# Patient Record
Sex: Female | Born: 1975 | Race: White | Hispanic: Yes | Marital: Married | Smoking: Never smoker
Health system: Southern US, Community
[De-identification: ages and names within clinical notes are randomized; demographics above are authoritative.]

## PROBLEM LIST (undated history)

## (undated) DIAGNOSIS — O24419 Gestational diabetes mellitus in pregnancy, unspecified control: Secondary | ICD-10-CM

## (undated) DIAGNOSIS — I10 Essential (primary) hypertension: Secondary | ICD-10-CM

## (undated) DIAGNOSIS — E039 Hypothyroidism, unspecified: Secondary | ICD-10-CM

## (undated) HISTORY — DX: Essential (primary) hypertension: I10

## (undated) HISTORY — DX: Gestational diabetes mellitus in pregnancy, unspecified control: O24.419

---

## 1997-10-23 ENCOUNTER — Ambulatory Visit (HOSPITAL_COMMUNITY): Admission: RE | Admit: 1997-10-23 | Discharge: 1997-10-23 | Payer: Self-pay | Admitting: Family Medicine

## 1997-11-15 ENCOUNTER — Ambulatory Visit (HOSPITAL_COMMUNITY): Admission: RE | Admit: 1997-11-15 | Discharge: 1997-11-15 | Payer: Self-pay | Admitting: Family Medicine

## 2002-03-14 ENCOUNTER — Emergency Department (HOSPITAL_COMMUNITY): Admission: EM | Admit: 2002-03-14 | Discharge: 2002-03-14 | Payer: Self-pay | Admitting: *Deleted

## 2002-03-17 ENCOUNTER — Inpatient Hospital Stay (HOSPITAL_COMMUNITY): Admission: AD | Admit: 2002-03-17 | Discharge: 2002-03-17 | Payer: Self-pay | Admitting: *Deleted

## 2002-03-17 ENCOUNTER — Encounter: Payer: Self-pay | Admitting: *Deleted

## 2002-03-28 ENCOUNTER — Encounter: Admission: RE | Admit: 2002-03-28 | Discharge: 2002-03-28 | Payer: Self-pay | Admitting: *Deleted

## 2003-02-18 ENCOUNTER — Inpatient Hospital Stay (HOSPITAL_COMMUNITY): Admission: AD | Admit: 2003-02-18 | Discharge: 2003-02-18 | Payer: Self-pay | Admitting: Obstetrics

## 2003-02-18 ENCOUNTER — Inpatient Hospital Stay (HOSPITAL_COMMUNITY): Admission: AD | Admit: 2003-02-18 | Discharge: 2003-02-20 | Payer: Self-pay | Admitting: Obstetrics

## 2005-01-01 ENCOUNTER — Ambulatory Visit (HOSPITAL_COMMUNITY): Admission: RE | Admit: 2005-01-01 | Discharge: 2005-01-01 | Payer: Self-pay | Admitting: *Deleted

## 2005-01-23 ENCOUNTER — Inpatient Hospital Stay (HOSPITAL_COMMUNITY): Admission: AD | Admit: 2005-01-23 | Discharge: 2005-01-23 | Payer: Self-pay | Admitting: Obstetrics & Gynecology

## 2005-04-24 ENCOUNTER — Ambulatory Visit: Payer: Self-pay | Admitting: *Deleted

## 2005-04-24 ENCOUNTER — Inpatient Hospital Stay (HOSPITAL_COMMUNITY): Admission: AD | Admit: 2005-04-24 | Discharge: 2005-04-28 | Payer: Self-pay | Admitting: Obstetrics and Gynecology

## 2005-04-24 ENCOUNTER — Ambulatory Visit: Payer: Self-pay | Admitting: Obstetrics and Gynecology

## 2005-04-25 ENCOUNTER — Encounter (INDEPENDENT_AMBULATORY_CARE_PROVIDER_SITE_OTHER): Payer: Self-pay | Admitting: Specialist

## 2005-04-25 DIAGNOSIS — O322XX0 Maternal care for transverse and oblique lie, not applicable or unspecified: Secondary | ICD-10-CM

## 2007-11-14 ENCOUNTER — Emergency Department (HOSPITAL_COMMUNITY): Admission: EM | Admit: 2007-11-14 | Discharge: 2007-11-14 | Payer: Self-pay | Admitting: Emergency Medicine

## 2007-11-18 ENCOUNTER — Ambulatory Visit: Payer: Self-pay | Admitting: Internal Medicine

## 2007-11-18 ENCOUNTER — Observation Stay (HOSPITAL_COMMUNITY): Admission: EM | Admit: 2007-11-18 | Discharge: 2007-11-21 | Payer: Self-pay | Admitting: Emergency Medicine

## 2007-11-25 ENCOUNTER — Encounter: Payer: Self-pay | Admitting: Internal Medicine

## 2007-11-25 ENCOUNTER — Ambulatory Visit: Payer: Self-pay | Admitting: Internal Medicine

## 2007-11-25 DIAGNOSIS — Z8639 Personal history of other endocrine, nutritional and metabolic disease: Secondary | ICD-10-CM

## 2007-11-25 DIAGNOSIS — Z862 Personal history of diseases of the blood and blood-forming organs and certain disorders involving the immune mechanism: Secondary | ICD-10-CM

## 2007-11-28 LAB — CONVERTED CEMR LAB
ALT: 83 units/L — ABNORMAL HIGH (ref 0–35)
AST: 44 units/L — ABNORMAL HIGH (ref 0–37)
Albumin: 4.3 g/dL (ref 3.5–5.2)
Alkaline Phosphatase: 274 units/L — ABNORMAL HIGH (ref 39–117)
BUN: 14 mg/dL (ref 6–23)
CO2: 26 meq/L (ref 19–32)
Calcium: 9.3 mg/dL (ref 8.4–10.5)
Chloride: 102 meq/L (ref 96–112)
Creatinine, Ser: 0.42 mg/dL (ref 0.40–1.20)
Glucose, Bld: 100 mg/dL — ABNORMAL HIGH (ref 70–99)
Potassium: 3.9 meq/L (ref 3.5–5.3)
Sodium: 139 meq/L (ref 135–145)
Total Bilirubin: 0.6 mg/dL (ref 0.3–1.2)
Total Protein: 7.5 g/dL (ref 6.0–8.3)

## 2010-04-02 ENCOUNTER — Ambulatory Visit (HOSPITAL_COMMUNITY)
Admission: RE | Admit: 2010-04-02 | Discharge: 2010-04-02 | Payer: Self-pay | Source: Home / Self Care | Attending: Obstetrics & Gynecology | Admitting: Obstetrics & Gynecology

## 2010-04-04 ENCOUNTER — Encounter
Admission: RE | Admit: 2010-04-04 | Discharge: 2010-04-04 | Payer: Self-pay | Source: Home / Self Care | Attending: Family Medicine | Admitting: Family Medicine

## 2010-08-22 ENCOUNTER — Inpatient Hospital Stay (HOSPITAL_COMMUNITY): Admission: AD | Admit: 2010-08-22 | Payer: Self-pay | Source: Home / Self Care | Admitting: Obstetrics & Gynecology

## 2010-08-26 NOTE — Discharge Summary (Signed)
NAME:  Sharon Brennan, Sharon Brennan         ACCOUNT NO.:  [id number]   MEDICAL RECORD NO.:  [id number]          PATIENT TYPE:  OBV   LOCATION:  5123                         FACILITY:  [facility]   PHYSICIAN:  Ileana Roup, M.D.  DATE OF BIRTH:  1975/10/25   DATE OF ADMISSION:  11/18/2007  DATE OF DISCHARGE:  11/21/2007                               DISCHARGE SUMMARY   DISCHARGE DIAGNOSES:  1. Elevated transaminases and alkaline phosphatase with no      hyperbilirubinemia, etiology unclear.  2. Polyarthralgias.  3. Urinary tract infection.   DISCHARGE MEDICATIONS:  Doxycycline 100 mg p.o. b.i.d. x7 days  Ibuprofen 400 to 600 mg p.o. q.6 h. p.r.n. headache.   DISPOSITION AND FOLLOWUP:  The patient is to return to see Dr. Reynold Bowen  in the outpatient clinic on November 25, 2007, at 1:50 p.m.  At this time,  the patient should have a repeat CMET done to evaluate her LFTs.  The  patient also has multiple pending labs, which include transferrin, GGT,  ferritin, TIBC, blood ceruloplasmin, HIV RNA and an antimitochondrial  antibody.  The patient is Spanish speaking only and will require an  interpreter.  At this time, the patient's arthralgias and headache  should be assessed to make sure it has completely resolved.   PROCEDURES PERFORMED:  The patient had an abdominal ultrasound done on  November 19, 2007.  Impression:  Unremarkable abdominal ultrasound.   There were no consultations made.   BRIEF HISTORY AND PHYSICAL:  The patient is a 35 year old white woman  G2, P2 with past medical history of recently treated UTI as an  outpatient on November 14, 2007.  She was treated with Cipro.  The patient  presents with persistent fever, chills, headaches, dysuria, and bad-  smelling urine.  She was seen 5 days prior to admission in the ED for  nausea, vomiting, diarrhea, dysuria, and a urine culture grew E. coli  which was pansensitive.  She was treated with Cipro.  She returns on the  day of admission because  of fevers especially in the afternoon with no  chills noted.  Her nausea has since resolved, but her diarrhea persists.  She has been taking her antibiotics as indicated.  The patient also  complains of decreased appetite.   Of note, review of systems is positive for myalgias, arthralgias, and  subjective fevers.  Specifically, she complains of bilateral wrist pain  as well as lower extremity pain.   For allergies, past medical history, medications, substance history,  social history, and family history, please see hospital chart.   REVIEW OF SYSTEMS:  As mentioned in the HPI.   PHYSICAL EXAMINATION:  VITAL SIGNS:  Temperature 99.4, blood pressure  119/75, pulse 127, respiratory rate 18, and O2 sats 97% on room air.  GENERAL:  NAD.  EYES:  Anicteric, PERRLA, EOMI.  ENT:  Dry mucous membranes.  NECK:  Supple.  No LAD.  RESPIRATORY:  Good air movement.  CTA bilaterally.  CV:  Regular rate and rhythm.  S1 and S2.  GI:  Positive bowel sounds.  Nondistended.  Tenderness on suprapubic  region.  No CVA tenderness.  EXTREMITIES:  Normal.  NEUROLOGIC:  Nonfocal.   ADMISSION LABS:  Sodium 134, potassium 3.1, chloride 101, bicarb 27, BUN  3, creatinine 0.6, and glucose 116.  WBC 6.0, hemoglobin 13.1, platelets  164, ANC 4.3, and MCV 88.4.  Ionized calcium 0.97.  Urine culture as I  mentioned above E. coli positive pansensitive, this was from June 14, 2007.  Urine pregnancy test negative.  UA essentially negative.  Alkaline phosphatase 453, SGOT (AST) 98,  SGPT (ALT) 310, total  bilirubin 1.0.   HOSPITAL COURSE BY PROBLEM:  1. Elevated transaminases and alkaline phosphatase with no      hyperbilirubinemia.  The etiology of the patient's elevated liver      enzymes is not certain at this point.  She is currently in the      process of being worked up.  Her alkaline phosphatase is      significantly elevated, and her AST and ALT are trending down.  The      patient's bilirubin has been  normal.  Of note, workup so far has      included a negative HIV test, negative rheumatoid factor, ESR, GC      chlamydia, blood cultures, Tylenol level, TSH, anti-double stranded      DNA, and a hepatitis panel.  Labs did come back positive for      antinuclear antibodies with a titer unknown at this point.  Pending      labs as mentioned above include AMA, ferritin, TIBC, transferrin      saturation, HIV RNA, ceruloplasmin, GGT, and antimitochondrial      antibody.  An abdominal ultrasound that was negative.  Today at the      time of discharge, the patient is essentially asymptomatic, but      does complain of a small headache.  Her alkaline phosphatase      remains elevated and was noted to be elevated in 2007, only in the      180s.  Medication could also be causing her elevated liver enzymes      as the patient was sent on Cipro, which does have risk of increased      transaminases, but is significantly low.  The patient's pending      labs will need to be followed up in the outpatient setting      including her titer for her ANA.  Further workup depending on what      is found may include hemochromatosis depending on what her      transferrin saturation was.  2. Polyarthralgias.  The patient's polyarthralgias had resolved by the      time of discharge.  Of note, she was put on doxycycline to cover      possible RMSF given her low normal platelets and arthralgias,      although this was felt to be unlikely.  This will be continued for      several more days in the outpatient setting.  Also of note, the      patient did have a positive ANA, but that double-stranded DNA was      negative.  This should be followed up in the outpatient setting as      well.  3. Urinary tract infection.  The patient was initially admitted with      concerns for possible pyelonephritis versus urinary tract infection      given her symptoms.  A UA was unremarkable.  When the patient was  admitted, she  received 1 dose of Rocephin, which was discontinued,      and the patient was put on doxycycline as noted above.  If the      patient's symptoms persist, repeat UA and urine C&S may be needed.   DISCHARGE LABS:  Acute hepatitis panel negative.  Sodium 134, potassium  4.2, chloride 98, bicarb 27, glucose 119, BUN 6, and creatinine 0.43.  Total bilirubin 0.9, alkaline phosphatase 416, AST 55, ALT 158, total  protein 6.6, albumin 3.1, and calcium 9.0.   DISCHARGE VITALS:  Temperature 97.6, pulse 105, respiratory rate 16,  blood pressure 170/83, and O2 sats 96% on room air.      Rufina Falco, M.D.  Electronically Signed      Ileana Roup, M.D.  Electronically Signed    JY/MEDQ  D:  11/21/2007  T:  11/22/2007  Job:  [id number]   cc:   Olene Craven, M.D.

## 2010-08-29 ENCOUNTER — Inpatient Hospital Stay (HOSPITAL_COMMUNITY)
Admission: AD | Admit: 2010-08-29 | Discharge: 2010-08-31 | DRG: 775 | Disposition: A | Discharge status: Home / Self Care | Payer: Medicaid Other | Source: Ambulatory Visit | Attending: Family Medicine | Admitting: Family Medicine

## 2010-08-29 ENCOUNTER — Other Ambulatory Visit: Payer: Self-pay

## 2010-08-29 DIAGNOSIS — O480 Post-term pregnancy: Secondary | ICD-10-CM

## 2010-08-29 DIAGNOSIS — O4100X0 Oligohydramnios, unspecified trimester, not applicable or unspecified: Secondary | ICD-10-CM

## 2010-08-29 DIAGNOSIS — O34219 Maternal care for unspecified type scar from previous cesarean delivery: Secondary | ICD-10-CM | POA: Diagnosis present

## 2010-08-29 LAB — CBC
HCT: 37.8 % (ref 36.0–46.0)
Hemoglobin: 12.2 g/dL (ref 12.0–15.0)
MCHC: 32.3 g/dL (ref 30.0–36.0)
MCV: 88.9 fL (ref 78.0–100.0)
RDW: 15 % (ref 11.5–15.5)

## 2010-08-29 LAB — COMPREHENSIVE METABOLIC PANEL
ALT: 19 U/L (ref 0–35)
Alkaline Phosphatase: 232 U/L — ABNORMAL HIGH (ref 39–117)
BUN: 11 mg/dL (ref 6–23)
CO2: 17 mEq/L — ABNORMAL LOW (ref 19–32)
Calcium: 9.3 mg/dL (ref 8.4–10.5)
GFR calc non Af Amer: >60 mL/min (ref >60)
Glucose, Bld: 114 mg/dL — ABNORMAL HIGH (ref 70–99)
Potassium: 4 mEq/L (ref 3.5–5.1)
Sodium: 132 mEq/L — ABNORMAL LOW (ref 135–145)
Total Protein: 6.2 g/dL (ref 6.0–8.3)

## 2010-08-29 LAB — URINALYSIS, DIPSTICK ONLY
Protein, ur: NEGATIVE mg/dL
Specific Gravity, Urine: 1.01 (ref 1.005–1.030)
Urobilinogen, UA: 0.2 mg/dL (ref 0.0–1.0)

## 2010-08-29 LAB — RPR: RPR Ser Ql: NONREACTIVE

## 2010-08-29 LAB — PROTEIN / CREATININE RATIO, URINE
Creatinine, Urine: 37.75 mg/dL
Protein Creatinine Ratio: 0.26 — ABNORMAL HIGH (ref 0.00–0.15)
Total Protein, Urine: 9.7 mg/dL
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
## 2010-08-29 NOTE — Op Note (Signed)
NAME:  Sharon Brennan, Sharon Brennan         ACCOUNT NO.:  [id number]   MEDICAL RECORD NO.:  [id number]          PATIENT TYPE:  INP   LOCATION:                                [facility]   PHYSICIAN:  Phil D. Okey Dupre, M.D.     DATE OF BIRTH:  [birth year]   DATE OF PROCEDURE:  DATE OF DISCHARGE:                                 OPERATIVE REPORT   PROCEDURE:  Low transverse cesarean section.   PREOPERATIVE DIAGNOSES:  1.  Cephalopelvic disproportion.  2.  Transverse arrest.   POSTOPERATIVE DIAGNOSES:  1.  Cephalopelvic disproportion.  2.  Transverse arrest.   SURGEON:  Phil D. Okey Dupre, M.D.   FIRST ASSISTANT:  Marlinda Mike, C.N.M.   ANESTHESIA:  Epidural.   SPECIMENS TO PATHOLOGY:  Placenta.   ESTIMATED BLOOD LOSS:  700 mL.  Marland Kitchen   POSTOPERATIVE CONDITION:  Satisfactory.   OPERATIVE FINDINGS:  A baby girl at 4:37 a.m., Apgars 9 and 9, pH 7.26,  weight 8 pounds 6 ounces and a deep LOT transverse arrest.   DESCRIPTION OF PROCEDURE:  Under satisfactory epidural anesthesia with the  patient in dorsal supine position, a Foley catheter placed in the urinary  bladder.  The abdomen was prepped and draped in usual sterile manner,  entered through a transverse suprapubic incision extending for a total  length of 14 cm and situated 2 cm above the symphysis pubis.  The abdomen  was entered by layers.  On entering the peritoneal cavity, the visceral  peritoneum and the anterior surface of the uterus opened transversely by  sharp dissection.  The bladder pushed away.  The lower uterine segment was  entered by sharp and blunt dissection and from the LOT presentation the baby  was brought out of the pelvis with some difficulty.  The baby was then  easily delivered.  The cord was doubly clamped and divided and the baby  handed to the pediatrician after the airway had been sucked with a bulb  syringe.  Specimens taken from the cord for the sampling and the placenta  manually removed.  The uterus was  explored and closed with a continuous  running locked 0 Vicryl on an atraumatic needle.  The area was observed for  bleeding.  None was noted.  The pelvis was irrigated several times with  normal saline, and the abdominal fascia closed with continuous running 0  Vicryl on an atraumatic needle. Fatty tissue slowed down the procedure  significantly and made identification of the fascia somewhat more time  consuming and more difficult than usual.  Subcutaneous bleeders  were controlled with hot cautery.  Skin staples used for skin edge  approximation.  Dry sterile dressing was applied.  The patient transferred  to the recovery room in satisfactory condition having tolerated the  procedure well.  Tape, instrument, sponge and needle count reported correct  at the end of the procedure.           ______________________________  Javier Glazier. Okey Dupre, M.D.     PDR/MEDQ  D:  04/25/2005  T:  04/26/2005  Job:  [id number]

## 2010-09-02 ENCOUNTER — Inpatient Hospital Stay (HOSPITAL_COMMUNITY)
Admission: AD | Admit: 2010-09-02 | Discharge: 2010-09-04 | DRG: 776 | Disposition: A | Discharge status: Home / Self Care | Payer: Medicaid Other | Source: Ambulatory Visit | Attending: Obstetrics & Gynecology | Admitting: Obstetrics & Gynecology

## 2010-09-02 DIAGNOSIS — IMO0002 Reserved for concepts with insufficient information to code with codable children: Principal | ICD-10-CM | POA: Diagnosis present

## 2010-09-02 LAB — CBC
HCT: 35.4 % — ABNORMAL LOW (ref 36.0–46.0)
Hemoglobin: 11.4 g/dL — ABNORMAL LOW (ref 12.0–15.0)
RBC: 3.93 MIL/uL (ref 3.87–5.11)
RDW: 15.3 % (ref 11.5–15.5)
WBC: 8.3 10*3/uL (ref 4.0–10.5)

## 2010-09-02 LAB — COMPREHENSIVE METABOLIC PANEL
ALT: 121 U/L — ABNORMAL HIGH (ref 0–35)
Alkaline Phosphatase: 183 U/L — ABNORMAL HIGH (ref 39–117)
Chloride: 104 mEq/L (ref 96–112)
Glucose, Bld: 102 mg/dL — ABNORMAL HIGH (ref 70–99)
Potassium: 4 mEq/L (ref 3.5–5.1)
Sodium: 140 mEq/L (ref 135–145)
Total Protein: 6.6 g/dL (ref 6.0–8.3)

## 2010-09-02 LAB — URINALYSIS, ROUTINE W REFLEX MICROSCOPIC
Bilirubin Urine: NEGATIVE
Glucose, UA: NEGATIVE mg/dL
Glucose, UA: NEGATIVE mg/dL
Hgb urine dipstick: NEGATIVE
Ketones, ur: NEGATIVE mg/dL
Ketones, ur: NEGATIVE mg/dL
Protein, ur: 30 mg/dL — AB
Protein, ur: NEGATIVE mg/dL

## 2010-09-02 LAB — URINE MICROSCOPIC-ADD ON

## 2010-09-02 LAB — MRSA PCR SCREENING: MRSA by PCR: NEGATIVE

## 2010-09-04 LAB — COMPREHENSIVE METABOLIC PANEL
ALT: 109 U/L — ABNORMAL HIGH (ref 0–35)
AST: 119 U/L — ABNORMAL HIGH (ref 0–37)
Albumin: 2.6 g/dL — ABNORMAL LOW (ref 3.5–5.2)
Alkaline Phosphatase: 181 U/L — ABNORMAL HIGH (ref 39–117)
Chloride: 101 mEq/L (ref 96–112)
GFR calc Af Amer: >60 mL/min (ref >60)
Potassium: 4.1 mEq/L (ref 3.5–5.1)
Total Bilirubin: 0.1 mg/dL — ABNORMAL LOW (ref 0.3–1.2)

## 2010-09-11 NOTE — Discharge Summary (Addendum)
  NAME:  Sharon Brennan, BOHLMAN         ACCOUNT NO.:  [id number]  MEDICAL RECORD NO.:  [id number]           PATIENT TYPE:  I  LOCATION:  9371                          [facility]  [facility]  PHYSICIAN:  Horton Chin, MD DATE OF BIRTH:  [birth year]  DATE OF ADMISSION:  09/02/2010 DATE OF DISCHARGE:  09/04/2010                              DISCHARGE SUMMARY   ADMISSION DIAGNOSES: 1. Status post spontaneous vaginal delivery/vaginal birth after     delivery postpartum day #4. 2. Preeclampsia.  DISCHARGE DIAGNOSES: 1. Postpartum status post spontaneous vaginal delivery vaginal birth     after cesarean postpartum day number #5. 2. Preeclampsia status post magnesium. 3. Hypertension.  ATTENDING:  Horton Chin, MD  FELLOW: Dr. Orvan Falconer.  DISCHARGE MEDICATIONS:  Labetalol 200 mg 1 tablet p.o. b.i.d.  HOSPITAL COURSE:  This is a 35 year old gravida 4, para 3-0-1-3 status post VBAC/spontaneous vaginal delivery of Aug 30, 2010, presenting with elevated blood pressures at home.  The patient also presenting complaint of headache and some blurry vision and some mild right upper quadrant pain.  Of note, the patient did have some elevated blood pressures prior to delivery but did not have any evidence of preeclampsia at that time.  She did not receive any magnesium immediately postpartum.  Prior to discharge, her blood pressures were within normal region.  On admission, she did have elevated liver enzymes with an AST of 152 and ALT of 121, bilirubin was normal.  Her urine protein creatinine was 0.3.  Her CBC was within normal limits.  The patient was admitted for preeclampsia, and she did receive magnesium for 24 hours.  She was subsequently started on labetalol for continued hypertension in the hospital.  On the day of discharge, she was hemodynamically stable.  Blood pressures were 110 to 120 systolic.  Her symptoms had improved, and she did have a CMP on the day of discharge, which  showed improvement in LFTs.  She was discharged home in otherwise stable condition.  DISPOSITION:  Discharged to home.  DISCHARGE CONDITION:  Stable.  FOLLOWUP:  The patient to follow up in GYN clinic in 2 weeks, on September 19, 2010, for blood pressure check and ER warnings signs, return to the emergency department for any fever, chills, nausea, vomiting, worsening headaches, problems with her vision, increased swelling, seizures, increased bleeding, or any other concerning symptoms.    ______________________________ Maryelizabeth Kaufmann, MD   ______________________________ Horton Chin, MD    LC/MEDQ  D:  09/04/2010  T:  09/04/2010  Job:  [id number]  Electronically Signed by Maryelizabeth Kaufmann MD on 09/11/2010 01:48:52 PM Electronically Signed by Jaynie Collins MD on 09/23/2010 05:01:54 PM

## 2010-09-19 ENCOUNTER — Ambulatory Visit: Payer: Self-pay | Admitting: Obstetrics and Gynecology

## 2010-09-20 NOTE — Group Therapy Note (Signed)
NAME:  Sharon Brennan, Sharon Brennan         ACCOUNT NO.:  [id number]  MEDICAL RECORD NO.:  [id number]           PATIENT TYPE:  A  LOCATION:  [facility]                   FACILITY:  [facility]  PHYSICIAN:  Catalina Antigua, MD     DATE OF BIRTH:  01/30/1976  DATE OF SERVICE:  09/19/2010                                 CLINIC NOTE  This is a 35 year old, G4, P 3-0-1-3, who is status post VBAC on Aug 30, 2010, was readmitted on postoperative day #4 secondary to postpartum preeclampsia.  The patient was discharged after receiving magnesium sulfate with a prescription of labetalol 200 b.i.d.  The patient presents today for a blood pressure check.  The patient has been doing well, has been taking her labetalol.  She denies any episodes of headaches or blurry vision or abdominal pain.  The patient is coping well with her new infant.  Her blood pressure today was 110/75 and 2+ reflexes.  The patient was advised to continue taking her labetalol and to return in 3 weeks for full postpartum check.  The patient is planning to using Implanon for birth control, [facility].          ______________________________ Catalina Antigua, MD    PC/MEDQ  D:  09/19/2010  T:  09/20/2010  Job:  [id number]

## 2010-10-10 ENCOUNTER — Ambulatory Visit (INDEPENDENT_AMBULATORY_CARE_PROVIDER_SITE_OTHER): Payer: Self-pay | Admitting: Physician Assistant

## 2010-10-10 DIAGNOSIS — I10 Essential (primary) hypertension: Secondary | ICD-10-CM

## 2010-12-24 ENCOUNTER — Encounter: Payer: Self-pay | Admitting: Family Medicine

## 2010-12-24 ENCOUNTER — Ambulatory Visit (INDEPENDENT_AMBULATORY_CARE_PROVIDER_SITE_OTHER): Payer: Self-pay | Admitting: Family Medicine

## 2010-12-24 VITALS — BP 122/78 | HR 85 | Temp 97.0°F | Ht <= 58 in | Wt 156.6 lb

## 2010-12-24 DIAGNOSIS — I10 Essential (primary) hypertension: Secondary | ICD-10-CM

## 2010-12-24 NOTE — Progress Notes (Signed)
  Subjective:    Patient ID: Sharon Brennan, female    DOB: [birth year], 35 y.o.   MRN: [id number]  Hypertension This is a chronic problem. Episode onset: during pregnancy. The problem has been gradually improving since onset. The problem is controlled. Associated symptoms include headaches. Pertinent negatives include no blurred vision, malaise/fatigue, neck pain, orthopnea, palpitations, peripheral edema, PND or shortness of breath. There are no associated agents to hypertension. Risk factors for coronary artery disease include family history and obesity. Past treatments include diuretics. The current treatment provides moderate improvement. There are no compliance problems.       Review of Systems  Constitutional: Negative.  Negative for malaise/fatigue.  HENT: Negative for neck pain.   Eyes: Negative for blurred vision.  Respiratory: Negative for shortness of breath.   Cardiovascular: Negative for palpitations, orthopnea and PND.  Neurological: Positive for headaches.       Objective:   Physical Exam  Constitutional: She appears well-developed and well-nourished.  HENT:  Head: Normocephalic and atraumatic.  Eyes: Pupils are equal, round, and reactive to light.  Neck: Normal range of motion. Neck supple.  Cardiovascular: Normal rate, regular rhythm and normal heart sounds.  Exam reveals no gallop and no friction rub.   No murmur heard. Pulmonary/Chest: Effort normal. No respiratory distress. She has no wheezes. She has no rales. She exhibits no tenderness.  Abdominal: Soft. Bowel sounds are normal. She exhibits no distension and no mass. There is no tenderness. There is no rebound and no guarding.          Assessment & Plan:  1.  Hypertension Controlled.  Patient losing weight and trying to improve diet.  Will have patient follow up in [facility] clinic to recheck blood pressure in about 1 month.  If continues to improve, would consider discontinuing medication at  that time.

## 2010-12-24 NOTE — Progress Notes (Signed)
Used interpreter # [id number]

## 2011-01-09 LAB — HIV ANTIBODY (ROUTINE TESTING W REFLEX): HIV: NONREACTIVE

## 2011-01-09 LAB — COMPREHENSIVE METABOLIC PANEL
ALT: 158 — ABNORMAL HIGH
ALT: 243 — ABNORMAL HIGH
ALT: 310 — ABNORMAL HIGH
AST: 55 — ABNORMAL HIGH
Albumin: 2.8 — ABNORMAL LOW
Albumin: 3.1 — ABNORMAL LOW
Alkaline Phosphatase: 410 — ABNORMAL HIGH
Alkaline Phosphatase: 453 — ABNORMAL HIGH
CO2: 25
CO2: 27
Calcium: 9
Chloride: 102
Chloride: 98
GFR calc Af Amer: >60
GFR calc non Af Amer: >60
GFR calc non Af Amer: >60
Glucose, Bld: 102 — ABNORMAL HIGH
Glucose, Bld: 110 — ABNORMAL HIGH
Potassium: 3 — ABNORMAL LOW
Potassium: 4.4
Sodium: 134 — ABNORMAL LOW
Sodium: 137
Sodium: 139
Total Bilirubin: 0.9
Total Bilirubin: 1
Total Protein: 5.9 — ABNORMAL LOW

## 2011-01-09 LAB — POCT I-STAT, CHEM 8
BUN: <3 — ABNORMAL LOW
Calcium, Ion: 1.04 — ABNORMAL LOW
Creatinine, Ser: 0.6
Glucose, Bld: 107 — ABNORMAL HIGH
HCT: 42
Hemoglobin: 13.3
Hemoglobin: 14.3
Potassium: 3.1 — ABNORMAL LOW
Potassium: 3.2 — ABNORMAL LOW
Sodium: 134 — ABNORMAL LOW
TCO2: 24
TCO2: 27

## 2011-01-09 LAB — URINE CULTURE
Colony Count: 8000
Colony Count: >=100000

## 2011-01-09 LAB — TSH: TSH: 3.697

## 2011-01-09 LAB — CBC
Hemoglobin: 12.3
MCHC: 34.5
MCV: 87.9
Platelets: 164
Platelets: 189
Platelets: UNDETERMINED
RDW: 13.1
RDW: 13.1
WBC: 6

## 2011-01-09 LAB — DIFFERENTIAL
Basophils Absolute: 0
Basophils Absolute: 0
Basophils Relative: 0
Eosinophils Absolute: 0
Eosinophils Relative: 0
Eosinophils Relative: 0
Eosinophils Relative: 1
Lymphocytes Relative: 22
Lymphs Abs: 0.9
Lymphs Abs: 1.3
Monocytes Absolute: 0.1
Monocytes Absolute: 0.3
Monocytes Relative: 6
Neutrophils Relative %: 73

## 2011-01-09 LAB — URINALYSIS, ROUTINE W REFLEX MICROSCOPIC
Bilirubin Urine: NEGATIVE
Bilirubin Urine: NEGATIVE
Glucose, UA: NEGATIVE
Ketones, ur: NEGATIVE
Ketones, ur: NEGATIVE
Nitrite: NEGATIVE
Protein, ur: NEGATIVE
Protein, ur: NEGATIVE
Specific Gravity, Urine: 1.008
Urobilinogen, UA: 1

## 2011-01-09 LAB — HEPATIC FUNCTION PANEL
ALT: 180 — ABNORMAL HIGH
AST: 56 — ABNORMAL HIGH
Total Protein: 5.9 — ABNORMAL LOW

## 2011-01-09 LAB — CULTURE, BLOOD (ROUTINE X 2): Culture: NO GROWTH

## 2011-01-09 LAB — GC/CHLAMYDIA PROBE AMP, URINE
Chlamydia, Swab/Urine, PCR: NEGATIVE
GC Probe Amp, Urine: NEGATIVE

## 2011-01-09 LAB — IRON AND TIBC: Saturation Ratios: 29

## 2011-01-09 LAB — ANTI-DNA ANTIBODY, DOUBLE-STRANDED: ds DNA Ab: 2 IU/mL (ref <5)

## 2011-01-09 LAB — RHEUMATOID FACTOR: Rhuematoid fact SerPl-aCnc: <20

## 2011-01-09 LAB — TRANSFERRIN: Transferrin: 286

## 2011-01-09 LAB — ANTI-NUCLEAR AB-TITER (ANA TITER)

## 2011-01-09 LAB — PROTEIN / CREATININE RATIO, URINE

## 2011-01-09 LAB — HEPATITIS PANEL, ACUTE: Hepatitis B Surface Ag: NEGATIVE

## 2011-01-09 LAB — FERRITIN: Ferritin: 372 — ABNORMAL HIGH (ref 10–291)

## 2011-01-09 LAB — GAMMA GT: GGT: 275 — ABNORMAL HIGH

## 2011-01-22 ENCOUNTER — Ambulatory Visit (INDEPENDENT_AMBULATORY_CARE_PROVIDER_SITE_OTHER): Payer: Self-pay | Admitting: Family Medicine

## 2011-01-22 ENCOUNTER — Encounter: Payer: Self-pay | Admitting: Family Medicine

## 2011-01-22 VITALS — BP 130/82 | HR 111 | Temp 97.6°F | Ht <= 58 in | Wt 151.7 lb

## 2011-01-22 DIAGNOSIS — I10 Essential (primary) hypertension: Secondary | ICD-10-CM

## 2011-01-22 NOTE — Patient Instructions (Signed)
Hipertensin (presin arterial elevada) (Hypertension, High Blood Pressure) Cuando el corazn late [employer] a travs de las arterias. La fuerza que se origina es la presin arterial. Si la presin es demasiado elevada, se denomina hipertensin. El peligro radica en que puede sufrirla y no saberlo. Hipertensin puede significar que su corazn debe trabajar ms intensamente para bombear sangre. Las arterias pueden Dietitian o rgidas. [place] extra [place] el riesgo de enfermedades cardacas, ictus y [employer].  La presin arterial est formada por dos nmeros: el nmero mayor sobre el nmero menor, por ejemplo110/70. Se seala "110/72". Los valores ideales son por debajo de 120 para el nmero ms alto (sistlica) y por debajo de 80 para el ms bajo (diastlica). Anote su presin sangunea hoy. Debe prestar mucha atencin a su presin arterial si sufre alguna otra enfermedad como:  Insuficiencia cardaca  Ataques cardiacos previos   Diabetes   Enfermedad renal crnica   Ictus previo   Mltiples factores de riesgo para enfermedades cardacas   Para diagnosticar si usted sufre hipertensin arterial, debe medirse la presin mientras encuentra sentado con el brazo elevado a la altura del nivel del corazn. Debe medirse al menos 2 veces. Una nica lectura de presin arterial elevada (especialmente en el servicio de emergencias) no significa que necesita tratamiento. Hay enfermedades en las que la presin arterial es diferente en ambos brazos. Es importante que consulte rpidamente con su mdico para un control. La [employer] de las personas sufren hipertensin esencial, lo que significa que no tiene una causa especfica. Este tipo de hipertensin puede bajarse modificando algunos factores en el estilo de vida como:  Librarian, academic.  El consumo de cigarrillos.   La falta de actividad fsica.   Peso excesivo  Consumo de drogas y alcohol.   Consumiendo menos sal   La mayora de las  personas no tienen sntomas hasta que la hipertensin ocasiona un dao en el organismo. El tratamiento efectivo puede evitar, Designer, industrial/product o reducir ese dao. TRATAMIENTO: El tratamiento para la hipertensin, cuando se ha identificado una causa, est dirigido a la misma Hay un gran nmero en medicamentos para tratarla. Se agrupan en diferentes categoras y Media planner los medicamentos indicados para usted. Muchos medicamentos disponibles tienen efectos secundarios. Debe comentar los efectos secundarios con su mdico. Si la presin arterial permanece elevada despus de modificar su estilo de vida o comenzar a tomar medicamentos:  Los medicamentos deben ser reemplazados   Puede ser necesario evaluar otros problemas.   Debe estar seguro que comprende las indicaciones, que sabe cmo y cundo [employer].   Asegrese de Education officer, environmental un control con su mdico dentro del tiempo indicado (generalmente dentro de las [employer]) para volver a Systems analyst presin arterial y Dentist los medicamentos prescritos.   Si est tomando ms de un medicamento para la presin arterial, asegrese que sabe cmo y en qu momentos debe tomarlos. Tomar los medicamentos al mismo tiempo puede dar como resultado un gran descenso en la presin arterial.  SOLICITE ATENCIN MDICA DE INMEDIATO SI PRESENTA:  Dolor de cabeza intenso, visin borrosa o cambios en la visin, o confusin.   Debilidad o adormecimientos inusuales o sensacin de desmayo.   Dolor de pecho o abdominal intenso, vmitos o problemas para respirar.  ASEGRESE QUE:   Comprende estas instrucciones.   Controlar su enfermedad.   Solicitar ayuda inmediatamente si no mejora o si empeora.  Document Released: 03/30/2005 Document Re-Released: 09/17/2009 [facility] Patient Information 2011 [place], [place].

## 2011-01-22 NOTE — Assessment & Plan Note (Signed)
Blood pressure continues to be elevated. Prescribed exercise 20-30 minutes 3-4 times a day. Decrease dietary salt No alcohol.

## 2011-01-22 NOTE — Progress Notes (Signed)
  Subjective:    Patient ID: Sharon Brennan, female    DOB: 1975/08/06, 35 y.o.   MRN: [id number]  HPI Patient seen for hypertension.  She is approximately 3 months postpartum from vaginal delivery for preeclampsia.  She was placed on HCTZ postpartum for elevated blood pressure and referred here for continued high blood pressure from [facility].  She reports that she has continued to take the medicine daily.  She denies continued chest pain, sob, palpitations, edema in legs, headache, vision changes.   Review of Systems  All other systems reviewed and are negative.       Objective:   Physical Exam  Constitutional: She is oriented to person, place, and time. She appears well-developed and well-nourished.  HENT:  Head: Normocephalic and atraumatic.  Neck: Normal range of motion. Neck supple.  Cardiovascular: Normal rate, regular rhythm and normal heart sounds.  Exam reveals no gallop and no friction rub.   No murmur heard. Pulmonary/Chest: Effort normal and breath sounds normal. No respiratory distress. She has no wheezes. She has no rales. She exhibits no tenderness.  Musculoskeletal: Normal range of motion.  Neurological: She is alert and oriented to person, place, and time.  Skin: Skin is warm and dry.          Assessment & Plan:  1. Hypertension Blood pressure continues to be elevated. Prescribed exercise 20-30 minutes 3-4 times a day. Decrease dietary salt No alcohol.

## 2014-02-12 ENCOUNTER — Encounter: Payer: Self-pay | Admitting: Family Medicine

## 2015-03-13 ENCOUNTER — Ambulatory Visit: Payer: Self-pay | Attending: Family Medicine

## 2015-04-01 ENCOUNTER — Encounter: Payer: Self-pay | Admitting: Family Medicine

## 2015-04-01 ENCOUNTER — Ambulatory Visit: Payer: Self-pay | Attending: Family Medicine | Admitting: Family Medicine

## 2015-04-01 ENCOUNTER — Telehealth: Payer: Self-pay | Admitting: Family Medicine

## 2015-04-01 VITALS — BP 149/99 | HR 108 | Temp 98.2°F | Resp 12 | Ht <= 58 in | Wt 156.8 lb

## 2015-04-01 DIAGNOSIS — E039 Hypothyroidism, unspecified: Secondary | ICD-10-CM | POA: Insufficient documentation

## 2015-04-01 DIAGNOSIS — Z9119 Patient's noncompliance with other medical treatment and regimen: Secondary | ICD-10-CM | POA: Insufficient documentation

## 2015-04-01 DIAGNOSIS — I10 Essential (primary) hypertension: Secondary | ICD-10-CM

## 2015-04-01 DIAGNOSIS — K0889 Other specified disorders of teeth and supporting structures: Secondary | ICD-10-CM

## 2015-04-01 DIAGNOSIS — Z79899 Other long term (current) drug therapy: Secondary | ICD-10-CM | POA: Insufficient documentation

## 2015-04-01 DIAGNOSIS — E038 Other specified hypothyroidism: Secondary | ICD-10-CM

## 2015-04-01 LAB — COMPLETE METABOLIC PANEL WITH GFR
ALT: 21 U/L (ref 6–29)
AST: 17 U/L (ref 10–30)
Albumin: 4.4 g/dL (ref 3.6–5.1)
Alkaline Phosphatase: 104 U/L (ref 33–115)
BUN: 10 mg/dL (ref 7–25)
CO2: 28 mmol/L (ref 20–31)
Calcium: 9.2 mg/dL (ref 8.6–10.2)
Chloride: 99 mmol/L (ref 98–110)
Creat: 0.44 mg/dL — ABNORMAL LOW (ref 0.50–1.10)
GFR, Est African American: >89 mL/min (ref >=60)
GLUCOSE: 93 mg/dL (ref 65–99)
POTASSIUM: 3.8 mmol/L (ref 3.5–5.3)
SODIUM: 139 mmol/L (ref 135–146)
TOTAL PROTEIN: 7.9 g/dL (ref 6.1–8.1)
Total Bilirubin: 0.4 mg/dL (ref 0.2–1.2)

## 2015-04-01 LAB — LIPID PANEL
CHOL/HDL RATIO: 3.1 ratio (ref <=5.0)
Cholesterol: 182 mg/dL (ref 125–200)
HDL: 58 mg/dL (ref >=46)
LDL CALC: 93 mg/dL (ref <130)
Triglycerides: 153 mg/dL — ABNORMAL HIGH (ref <150)
VLDL: 31 mg/dL — ABNORMAL HIGH (ref <30)

## 2015-04-01 LAB — TSH: TSH: 3.62 u[IU]/mL (ref 0.350–4.500)

## 2015-04-01 MED ORDER — LISINOPRIL-HYDROCHLOROTHIAZIDE 20-25 MG PO TABS: 1.0000 | ORAL_TABLET | Freq: Every day | ORAL | Status: DC

## 2015-04-01 MED ORDER — LEVOTHYROXINE SODIUM 50 MCG PO TABS: 50.0000 ug | ORAL_TABLET | Freq: Every day | ORAL | Status: DC

## 2015-04-01 NOTE — Telephone Encounter (Signed)
Referral to dentistry entered in Epic.

## 2015-04-01 NOTE — Telephone Encounter (Signed)
Patient has a broken molar that is painful when she eats and needs a cleaning as well and forgot to mention that she would like to be referred to [facility] because she has the orange card. Please follow up with pt. Thank you.

## 2015-04-01 NOTE — Patient Instructions (Signed)
Hipertensin (Hypertension) La hipertensin, conocida comnmente como presin arterial alta, se produce cuando la sangre bombea en las arterias con mucha fuerza. Las arterias son los vasos sanguneos que transportan la sangre desde el corazn hacia todas las partes del cuerpo. Una lectura de la presin arterial consiste en un nmero ms alto sobre un nmero ms bajo, por ejemplo, 110/72. El nmero ms alto (presin sistlica) corresponde a la presin interna de las arterias cuando el corazn bombea sangre. El nmero ms bajo (presin diastlica) corresponde a la presin interna de las arterias cuando el corazn se relaja. En condiciones ideales, la presin arterial debe ser inferior a 120/80. La hipertensin fuerza al corazn a trabajar ms para bombear la sangre. Las arterias pueden estrecharse o ponerse rgidas. La hipertensin no tratada o no controlada puede causar infarto de miocardio, ictus, enfermedad renal y otros problemas. FACTORES DE RIESGO Algunos factores de riesgo de hipertensin son controlables, pero otros no lo son.  Entre los factores de riesgo que usted no puede controlar, se incluyen los siguientes:   La raza. El riesgo es mayor para las personas afroamericanas.  La edad. Los riesgos aumentan con la edad.  El sexo. Antes de los 45aos, los hombres corren ms riesgo que las mujeres. Despus de los 65aos, las mujeres corren ms riesgo que los hombres. Entre los factores de riesgo que usted puede controlar, se incluyen los siguientes:  No hacer la cantidad suficiente de actividad fsica o ejercicio.  Tener sobrepeso.  Consumir mucha grasa, azcar, caloras o sal en la dieta.  Beber alcohol en exceso. SIGNOS Y SNTOMAS Por lo general, la hipertensin no causa signos o sntomas. La hipertensin arterial demasiado alta (crisis hipertensiva) puede causar dolor de cabeza, ansiedad, falta de aire y hemorragia nasal. DIAGNSTICO Para detectar si usted tiene hipertensin, el  mdico le medir la presin arterial mientras est sentado, con el brazo levantado a la altura del corazn. Debe medirla al menos dos veces en el mismo brazo. Determinadas condiciones pueden causar una diferencia de presin arterial entre el brazo izquierdo y el derecho. El hecho de tener una sola lectura de la presin arterial ms alta que lo normal no significa que necesita un tratamiento. Si no est claro si tiene hipertensin arterial, es posible que se le pida que regrese otro da para volver a controlarle la presin arterial. O bien se le puede pedir que se controle la presin arterial en su casa durante 1 o ms meses. TRATAMIENTO El tratamiento de la hipertensin arterial incluye hacer cambios en el estilo de vida y, posiblemente, tomar medicamentos. Un estilo de vida saludable puede ayudar a bajar la presin arterial alta. Quiz deba cambiar algunos hbitos. Los cambios en el estilo de vida pueden incluir lo siguiente:  Seguir la dieta DASH. Esta dieta tiene un alto contenido de frutas, verduras y cereales integrales. Incluye poca cantidad de sal, carnes rojas y azcares agregados.  Mantenga el consumo de sodio por debajo de 2300 mg por da.  Realizar al menos entre 30 y 45 minutos de ejercicio aerbico, 4 veces por semana como mnimo.  Perder peso, si es necesario.  No fumar.  Limitar el consumo de bebidas alcohlicas.  Aprender formas de reducir el estrs. El mdico puede recetarle medicamentos si los cambios en el estilo de vida no son suficientes para lograr controlar la presin arterial y si una de las siguientes afirmaciones es verdadera:  Tiene entre 18 y 59 aos y su presin arterial sistlica est por encima de 140.  Tiene   60 aos o ms y su presin arterial sistlica est por encima de 150.  Su presin arterial diastlica est por encima de 90.  Tiene diabetes y su presin arterial sistlica est por encima de 140 o su presin arterial diastlica est por encima de  90.  Tiene una enfermedad renal y su presin arterial est por encima de 140/90.  Tiene una enfermedad cardaca y su presin arterial est por encima de 140/90. La presin arterial deseada puede variar en funcin de las enfermedades, la edad y otros factores personales. INSTRUCCIONES PARA EL CUIDADO EN EL HOGAR  Haga que le midan de nuevo la presin arterial segn las indicaciones del mdico.  Tome los medicamentos solamente como se lo haya indicado el mdico. Siga cuidadosamente las indicaciones. Los medicamentos para la presin arterial deben tomarse segn las indicaciones. Los medicamentos pierden eficacia al omitir las dosis. El hecho de omitir las dosis tambin aumenta el riesgo de otros problemas.  No fume.  Contrlese la presin arterial en su casa segn las indicaciones del mdico. SOLICITE ATENCIN MDICA SI:   Piensa que tiene una reaccin alrgica a los medicamentos.  Tiene mareos o dolores de cabeza con recurrencia.  Tiene hinchazn en los tobillos.  Tiene problemas de visin. SOLICITE ATENCIN MDICA DE INMEDIATO SI:  Siente un dolor de cabeza intenso o confusin.  Siente debilidad inusual, adormecimiento o que se desmayar.  Siente dolor intenso en el pecho o en el abdomen.  Vomita repetidas veces.  Tiene dificultad para respirar. ASEGRESE DE QUE:   Comprende estas instrucciones.  Controlar su afeccin.  Recibir ayuda de inmediato si no mejora o si empeora.   Esta informacin no tiene como fin reemplazar el consejo del mdico. Asegrese de hacerle al mdico cualquier pregunta que tenga.   Document Released: 03/30/2005 Document Revised: 08/14/2014 Elsevier Interactive Patient Education 2016 Elsevier Inc.  

## 2015-04-01 NOTE — Progress Notes (Signed)
Patient here to establish care She reports thyroid disease but is not sure is she needs to take medication now She denies pain today She does not smoke, drink, or use illicit drugs She reports having preeclampsia in 2012

## 2015-04-01 NOTE — Progress Notes (Signed)
Subjective:  Patient ID: Sharon Brennan, female    DOB: 05-26-1975  Age: 39 y.o. MRN: [id number]  CC: Establish Care   HPI Sharon Brennan presents to establish care. Medical history is notable for hypothyroidism and hypertension for which she has been [facility] [address]. She ran out of her levothyroxine a couple of days ago but before then states she was instructed to alternate 50 g and 25 g she has been compliant with her antihypertensive.  She has no complaints today.  Past Medical History  Diagnosis Date  . Hypertension     Social History   Social History  . Marital Status: Single    Spouse Name: N/A  . Number of Children: N/A  . Years of Education: N/A   Occupational History  . Not on file.   Social History Main Topics  . Smoking status: Never Smoker   . Smokeless tobacco: Never Used  . Alcohol Use: No  . Drug Use: No  . Sexual Activity: Yes    Birth Control/ Protection: Implant   Other Topics Concern  . Not on file   Social History Narrative    No Known Allergies   Outpatient Prescriptions Prior to Visit  Medication Sig Dispense Refill  . hydrochlorothiazide 25 MG tablet Take 25 mg by mouth daily.       No facility-administered medications prior to visit.    ROS Review of Systems  Constitutional: Negative for activity change, appetite change and fatigue.  HENT: Negative for congestion, sinus pressure and sore throat.   Eyes: Negative for visual disturbance.  Respiratory: Negative for cough, chest tightness, shortness of breath and wheezing.   Cardiovascular: Negative for chest pain and palpitations.  Gastrointestinal: Negative for abdominal pain, constipation and abdominal distention.  Endocrine: Negative for polydipsia.  Genitourinary: Negative for dysuria and frequency.  Musculoskeletal: Negative for back pain and arthralgias.  Skin: Negative for rash.  Neurological: Negative for tremors,  light-headedness and numbness.  Hematological: Does not bruise/bleed easily.  Psychiatric/Behavioral: Negative for behavioral problems and agitation.    Objective:  BP 149/99 mmHg  Pulse 108  Temp(Src) 98.2 F (36.8 C)  Resp 12  Ht  (1.473 m)  Wt 156 lb 12.8 oz (71.124 kg)  BMI 32.78 kg/m2  SpO2 100%  LMP 03/25/2015  BP/Weight 04/01/2015 01/22/2011 12/24/2010  Systolic BP 149 130 122  Diastolic BP 99 82 78  Wt. (Lbs) 156.8 151.7 156.6  BMI 32.78 31.71 32.74      Physical Exam  Constitutional: She is oriented to person, place, and time. She appears well-developed and well-nourished.  Cardiovascular: Normal heart sounds and intact distal pulses.  Tachycardia present.   No murmur heard. Pulmonary/Chest: Effort normal and breath sounds normal. She has no wheezes. She has no rales. She exhibits no tenderness.  Abdominal: Soft. Bowel sounds are normal. She exhibits no distension and no mass. There is no tenderness.  Musculoskeletal: Normal range of motion.  Neurological: She is alert and oriented to person, place, and time.     Assessment & Plan:   1. Essential hypertension Uncontrolled. Switched from hydrochlorothiazide to lisinopril/hydrochlorothiazide  Advised on low-sodium, DASH diet. - COMPLETE METABOLIC PANEL WITH GFR - Lipid panel - lisinopril-hydrochlorothiazide (PRINZIDE,ZESTORETIC) 20-25 MG tablet; Take 1 tablet by mouth daily.  Dispense: 30 tablet; Refill: 3  2. Other specified hypothyroidism She has been noncompliant with her thyroid medications and so a thyroid function is abnormal I will make no regimen changes -  TSH - levothyroxine (SYNTHROID, LEVOTHROID) 50 MCG tablet; Take 1 tablet (50 mcg total) by mouth daily before breakfast. Reported on 04/01/2015  Dispense: 30 tablet; Refill: 1   Meds ordered this encounter  Medications  . levothyroxine (SYNTHROID, LEVOTHROID) 50 MCG tablet    Sig: Take 1 tablet (50 mcg total) by mouth daily before  breakfast. Reported on 04/01/2015    Dispense:  30 tablet    Refill:  1  . lisinopril-hydrochlorothiazide (PRINZIDE,ZESTORETIC) 20-25 MG tablet    Sig: Take 1 tablet by mouth daily.    Dispense:  30 tablet    Refill:  3    Follow-up: Return in about 1 month (around 05/02/2015) for follow up of hypertension.   Jaclyn ShaggyEnobong Amao MD

## 2015-04-02 ENCOUNTER — Telehealth: Payer: Self-pay

## 2015-04-02 NOTE — Telephone Encounter (Signed)
CMA called [employer] and spoke to [place]ren[place] [phone]. Interpreter verified patient name and DOB. Patient was given lab results and verbalized that she understood with no further questions.

## 2015-04-02 NOTE — Telephone Encounter (Signed)
-----   Message from Jaclyn ShaggyEnobong Amao, MD sent at 04/02/2015  8:43 AM EST ----- She has a normal thyroid function, total cholesterol is normal but triglycerides which is a type of cholesterol is mildly elevated. Encourage to exercise and take OTC omega 3 fatty acids

## 2015-04-08 ENCOUNTER — Encounter (HOSPITAL_COMMUNITY): Payer: Self-pay | Admitting: *Deleted

## 2015-04-08 ENCOUNTER — Emergency Department (HOSPITAL_COMMUNITY)
Admission: EM | Admit: 2015-04-08 | Discharge: 2015-04-08 | Disposition: A | Discharge status: Home / Self Care | Payer: Self-pay | Attending: Emergency Medicine | Admitting: Emergency Medicine

## 2015-04-08 DIAGNOSIS — W228XXA Striking against or struck by other objects, initial encounter: Secondary | ICD-10-CM | POA: Insufficient documentation

## 2015-04-08 DIAGNOSIS — Y9389 Activity, other specified: Secondary | ICD-10-CM | POA: Insufficient documentation

## 2015-04-08 DIAGNOSIS — Y9289 Other specified places as the place of occurrence of the external cause: Secondary | ICD-10-CM | POA: Insufficient documentation

## 2015-04-08 DIAGNOSIS — Y998 Other external cause status: Secondary | ICD-10-CM | POA: Insufficient documentation

## 2015-04-08 DIAGNOSIS — S0181XA Laceration without foreign body of other part of head, initial encounter: Secondary | ICD-10-CM | POA: Insufficient documentation

## 2015-04-08 DIAGNOSIS — Z23 Encounter for immunization: Secondary | ICD-10-CM | POA: Insufficient documentation

## 2015-04-08 DIAGNOSIS — I10 Essential (primary) hypertension: Secondary | ICD-10-CM | POA: Insufficient documentation

## 2015-04-08 MED ORDER — LIDOCAINE-EPINEPHRINE (PF) 2 %-1:200000 IJ SOLN
10.0000 mL | Freq: Once | INTRAMUSCULAR | Status: AC
  Administered 2015-04-08: 10 mL
  Filled 2015-04-08: qty 20

## 2015-04-08 MED ORDER — TETANUS-DIPHTH-ACELL PERTUSSIS 5-2.5-18.5 LF-MCG/0.5 IM SUSP
0.5000 mL | Freq: Once | INTRAMUSCULAR | Status: AC
  Administered 2015-04-08: 0.5 mL via INTRAMUSCULAR
  Filled 2015-04-08: qty 0.5

## 2015-04-08 NOTE — ED Notes (Signed)
Pt reports injuring her forehead on the corner of a cabinet - pt sustained superficial laceration to center of forehead, bleeding as ceased at this time. Pt denies LOC.

## 2015-04-08 NOTE — ED Provider Notes (Signed)
CSN: [id number]     Arrival date & time 04/08/15  1206 History   First MD Initiated Contact with Patient 04/08/15 1318     Chief Complaint  Patient presents with  . Head Laceration     (Consider location/radiation/quality/duration/timing/severity/associated sxs/prior Treatment) HPI   Pt states she stood on a chair to reach a high shelf and didn't realize the door was open, accidentally hit her head on the bottom corner of the cabinet door.  It bled initially and she was shocked but did not pass out or fall, denies other injury.  Last tetanus vx 2007.    Past Medical History  Diagnosis Date  . Hypertension    Past Surgical History  Procedure Laterality Date  . Cesarean section     History reviewed. No pertinent family history. Social History  Substance Use Topics  . Smoking status: Never Smoker   . Smokeless tobacco: None  . Alcohol Use: No   OB History    No data available     Review of Systems  Constitutional: Negative for fever and chills.  Skin: Positive for wound.  Allergic/Immunologic: Negative for immunocompromised state.  Hematological: Does not bruise/bleed easily.  Psychiatric/Behavioral: Negative for self-injury (accidental ).      Allergies  Review of patient's allergies indicates no known allergies.  Home Medications   Prior to Admission medications   Not on File   BP 123/83 mmHg  Pulse 110  Temp(Src) 98.1 F (36.7 C)  Resp 18  Ht 4\' 11"  (1.499 m)  Wt 68.493 kg  BMI 30.48 kg/m2  SpO2 100%  LMP 04/01/2015 Physical Exam  Constitutional: She appears well-developed and well-nourished. No distress.  HENT:  Head: Normocephalic. Head is with laceration.    Neck: Neck supple.  Pulmonary/Chest: Effort normal.  Neurological: She is alert.  Skin: She is not diaphoretic.  Nursing note and vitals reviewed.   ED Course  Procedures (including critical care time) Labs Review Labs Reviewed - No data to display  Imaging Review No results  found. I have personally reviewed and evaluated these images and lab results as part of my medical decision-making.   EKG Interpretation None       LACERATION REPAIR Performed by: Trixie DredgeWEST, Saia Derossett Authorized by: Trixie DredgeWEST, Anjela Cassara Consent: Verbal consent obtained. Risks and benefits: risks, benefits and alternatives were discussed Consent given by: patient Patient identity confirmed: provided demographic data Prepped and Draped in normal sterile fashion Wound explored  Laceration Location: forehead  Laceration Length: 4cm  No Foreign Bodies seen or palpated  Anesthesia: local infiltration  Local anesthetic: lidocaine 2% with epinephrine  Anesthetic total: 6 ml  Irrigation method: syringe Amount of cleaning: standard  Skin closure: 6-0 chromic gut  Number of sutures: 7  Technique: simple interrupted   Patient tolerance: Patient tolerated the procedure well with no immediate complications.   MDM   Final diagnoses:  Forehead laceration, initial encounter    Afebrile, nontoxic patient with accidental laceration to forehead.  No other injury.  No LOC.  No fall.  Tetanus updated.  Laceration repaired in ED.  Pt advised she will have a scar.   D/C home with PCP follow up PRN  Discussed result, findings, treatment, and follow up  with patient.  Pt given return precautions.  Pt verbalizes understanding and agrees with plan.        Trixie Dredgemily Seneca Hoback, PA-C 04/08/15 1430  Linwood DibblesJon Knapp, MD 04/09/15 [phone]

## 2015-04-08 NOTE — Discharge Instructions (Signed)
Read the information below.  You may return to the Emergency Department at any time for worsening condition or any new symptoms that concern you.  If you develop redness, swelling, pus draining from the wound, or fevers greater than 100.4, return to the ER immediately for a recheck.    Lea la informacin a continuacin. Usted puede regresar al [facility] en cualquier momento por empeoramiento de la condicin o cualquier nuevo sntoma que le afecte. Si desarrolla enrojecimiento, hinchazn, pus que drena de la herida, o fiebre mayor de 100.4, regrese inmediatamente a la sala de emergencias para una revisin.

## 2015-05-01 ENCOUNTER — Ambulatory Visit: Payer: Self-pay | Attending: Family Medicine | Admitting: Family Medicine

## 2015-05-01 ENCOUNTER — Encounter: Payer: Self-pay | Admitting: Family Medicine

## 2015-05-01 VITALS — BP 128/84 | HR 120 | Temp 98.2°F | Resp 13 | Ht <= 58 in | Wt 153.8 lb

## 2015-05-01 DIAGNOSIS — E038 Other specified hypothyroidism: Secondary | ICD-10-CM

## 2015-05-01 DIAGNOSIS — Z0000 Encounter for general adult medical examination without abnormal findings: Secondary | ICD-10-CM

## 2015-05-01 DIAGNOSIS — Z131 Encounter for screening for diabetes mellitus: Secondary | ICD-10-CM | POA: Insufficient documentation

## 2015-05-01 DIAGNOSIS — E039 Hypothyroidism, unspecified: Secondary | ICD-10-CM | POA: Insufficient documentation

## 2015-05-01 DIAGNOSIS — I10 Essential (primary) hypertension: Secondary | ICD-10-CM | POA: Insufficient documentation

## 2015-05-01 DIAGNOSIS — Z79899 Other long term (current) drug therapy: Secondary | ICD-10-CM | POA: Insufficient documentation

## 2015-05-01 DIAGNOSIS — Z23 Encounter for immunization: Secondary | ICD-10-CM | POA: Insufficient documentation

## 2015-05-01 DIAGNOSIS — E781 Pure hyperglyceridemia: Secondary | ICD-10-CM | POA: Insufficient documentation

## 2015-05-01 LAB — POCT GLYCOSYLATED HEMOGLOBIN (HGB A1C): Hemoglobin A1C: 5.9

## 2015-05-01 MED ORDER — LISINOPRIL-HYDROCHLOROTHIAZIDE 20-25 MG PO TABS: 1.0000 | ORAL_TABLET | Freq: Every day | ORAL | Status: DC

## 2015-05-01 MED ORDER — LEVOTHYROXINE SODIUM 50 MCG PO TABS: 50.0000 ug | ORAL_TABLET | Freq: Every day | ORAL | Status: DC

## 2015-05-01 MED FILL — ?LEVOTHYROXINE 50 MCG TABLE: 50 | 30 days supply | Qty: 30 | Fill #0

## 2015-05-01 MED FILL — LISINOPRIL-HCTZ 20-25 MG TA: 20-25 | 30 days supply | Qty: 30 | Fill #0

## 2015-05-01 NOTE — Progress Notes (Signed)
Patient here to follow up on HTN and thyroid She reports taking omega 3 She does need refills

## 2015-05-01 NOTE — Progress Notes (Signed)
Subjective:  Patient ID: Sharon Brennan, female    DOB: 1976/02/20  Age: 40 y.o. MRN: [id number]  CC: Follow-up   HPI Sharon Brennan eats a 40 year old female with a history of hypertension, hypothyroidism who comes into the clinic for follow-up visit on a hypothyroidism. At her last office visit, she was switched from hydrochlorothiazide to lisinopril/hydrochlorothiazide due to elevated blood pressure with resulting improvement in her blood pressure today. She remains compliant with her thyroid medications.  Has labs revealed normal thyroid panel and mildly elevated triglycerides which she has been taking OTC omega-3 Fish oil capsules. She has no complaints today  Outpatient Prescriptions Prior to Visit  Medication Sig Dispense Refill  . levothyroxine (SYNTHROID, LEVOTHROID) 50 MCG tablet Take 1 tablet (50 mcg total) by mouth daily before breakfast. Reported on 04/01/2015 30 tablet 1  . lisinopril-hydrochlorothiazide (PRINZIDE,ZESTORETIC) 20-25 MG tablet Take 1 tablet by mouth daily. 30 tablet 3   No facility-administered medications prior to visit.    ROS Review of Systems  Constitutional: Negative for activity change and appetite change.  HENT: Negative for sinus pressure and sore throat.   Respiratory: Negative for chest tightness, shortness of breath and wheezing.   Cardiovascular: Negative for chest pain and palpitations.  Gastrointestinal: Negative for abdominal pain, constipation and abdominal distention.  Genitourinary: Negative.   Musculoskeletal: Negative.   Psychiatric/Behavioral: Negative for behavioral problems and dysphoric mood.    Objective:  BP 128/84 mmHg  Pulse 120  Temp(Src) 98.2 F (36.8 C)  Resp 13  Ht  (1.473 m)  Wt 153 lb 12.8 oz (69.763 kg)  BMI 32.15 kg/m2  SpO2 100%  LMP 04/14/2015  BP/Weight 05/01/2015 04/01/2015 01/22/2011  Systolic BP 128 149 130  Diastolic BP 84 99 82  Wt. (Lbs) 153.8 156.8 151.7  BMI 32.15 32.78 31.71     Lipid Panel     Component Value Date/Time   CHOL 182 04/01/2015 1122   TRIG 153* 04/01/2015 1122   HDL 58 04/01/2015 1122   CHOLHDL 3.1 04/01/2015 1122   VLDL 31* 04/01/2015 1122   LDLCALC 93 04/01/2015 1122    CMP Latest Ref Rng 04/01/2015 09/04/2010 09/02/2010  Glucose 65 - 99 mg/dL 93 409(W) 119(J)  BUN 7 - 25 mg/dL Creatinine 0.50 - 1.10 mg/dL 4.78(G) 9.56 DELTA CHECK NOTED REPEATED TO VERIFY 0.84  Sodium 135 - 146 mmol/L 139 137 140  Potassium 3.5 - 5.3 mmol/L 3.8 4.1 4.0  Chloride 98 - 110 mmol/L 99 101 104  CO2 20 - 31 mmol/L Calcium 8.6 - 10.2 mg/dL 9.2 2.1(H) 8.8  Total Protein 6.1 - 8.1 g/dL 7.9 6.7 6.6  Total Bilirubin 0.2 - 1.2 mg/dL 0.4 0.8(M) 5.7(Q)  Alkaline Phos 33 - 115 U/L 104 181(H) 183(H)  AST 10 - 30 U/L 17 119(H) 152(H)  ALT 6 - 29 U/L 21 109(H) 121(H)    Lab Results  Component Value Date   TSH 3.620 04/01/2015     Physical Exam  Constitutional: She is oriented to person, place, and time. She appears well-developed and well-nourished.  Cardiovascular: Normal heart sounds and intact distal pulses.  Tachycardia present.   No murmur heard. Pulmonary/Chest: Effort normal and breath sounds normal. She has no wheezes. She has no rales. She exhibits no tenderness.  Abdominal: Soft. Bowel sounds are normal. She exhibits no distension and no mass. There is no tenderness.  Musculoskeletal: Normal range of motion.  Neurological: She is alert and oriented to person,  place, and time.     Assessment & Plan:   1. Diabetes mellitus screening A1c 5.9 - HgB A1c  2. Healthcare maintenance - Flu Vaccine QUAD 36+ mos PF IM (Fluarix & Fluzone Quad PF)  3. Essential hypertension Controlled Continue low-sodium diet - lisinopril-hydrochlorothiazide (PRINZIDE,ZESTORETIC) 20-25 MG tablet; Take 1 tablet by mouth daily.  Dispense: 30 tablet; Refill: 3  4. Other specified hypothyroidism Controlled - levothyroxine (SYNTHROID, LEVOTHROID) 50  MCG tablet; Take 1 tablet (50 mcg total) by mouth daily before breakfast. Reported on 04/01/2015  Dispense: 30 tablet; Refill: 1   Meds ordered this encounter  Medications  . lisinopril-hydrochlorothiazide (PRINZIDE,ZESTORETIC) 20-25 MG tablet    Sig: Take 1 tablet by mouth daily.    Dispense:  30 tablet    Refill:  3  . levothyroxine (SYNTHROID, LEVOTHROID) 50 MCG tablet    Sig: Take 1 tablet (50 mcg total) by mouth daily before breakfast. Reported on 04/01/2015    Dispense:  30 tablet    Refill:  1    Follow-up: Return in about 3 months (around 07/30/2015) for Follow-up of hypertension and hypothyroidism.Jaclyn Shaggy MD

## 2015-05-01 NOTE — Patient Instructions (Signed)
Hipotiroidismo (Hypothyroidism) El hipotiroidismo es un trastorno de la tiroides, una glndula grande ubicada en la parte anterior e inferior del cuello. La tiroides libera hormonas que controlan el funcionamiento del organismo. En los casos de hipotiroidismo, la glndula no produce la cantidad suficiente de estas hormonas. CAUSAS Las causas del hipotiroidismo pueden incluir lo siguiente:  Infecciones virales.  Embarazo.  Un ataque del sistema de defensa (sistema inmunitario) a la tiroides.  Algunos medicamentos.  Defectos de nacimiento.  Radioterapias anteriores en la cabeza o el cuello.  Tratamiento previo con yodo radioactivo.  Extirpacin quirrgica previa de una parte o de toda la tiroides.  Problemas con la glndula ubicada en el centro del cerebro (hipfisis). SIGNOS Y SNTOMAS Los signos y los sntomas de hipotiroidismo pueden ser los siguientes:  Sensacin de falta de energa (letargo).  Incapacidad para tolerar el fro.  Aumento de peso que no puede explicarse por un cambio en la dieta o en los hbitos de ejercicio fsico.  Piel seca.  Pelo grueso.  Irregularidades menstruales.  Ralentizacin de los procesos de pensamiento.  Estreimiento.  Tristeza o depresin. DIAGNSTICO  El mdico puede diagnosticar el hipotiroidismo con anlisis de sangre y ecografas. TRATAMIENTO El hipotiroidismo se trata con medicamentos que reemplazan las hormonas que el cuerpo no produce. Despus de comenzar el tratamiento, pueden pasar varias semanas hasta la desaparicin de los sntomas. INSTRUCCIONES PARA EL CUIDADO EN EL HOGAR   Tome los medicamentos solamente como se lo haya indicado el mdico.  Si empieza a tomar medicamentos nuevos, infrmele al mdico.  Concurra a todas las visitas de control como se lo haya indicado el mdico. Esto es importante. A medida que la enfermedad mejora, es posible que haya que modificar las dosis. Tendr que hacerse anlisis de sangre  peridicamente, de modo que el mdico pueda controlar la enfermedad. SOLICITE ATENCIN MDICA SI:  Los sntomas no mejoran con el tratamiento.  Est tomando medicamentos sustitutivos de la tiroides y:  Suda en exceso.  Siente temblores.  Est ansioso.  Baja de peso rpidamente.  No puede tolerar el calor.  Tiene cambios emocionales.  Tiene diarrea.  Se siente dbil. SOLICITE ATENCIN MDICA DE INMEDIATO SI:   Siente dolor en el pecho.  Tiene latidos cardacos irregulares o siente dolor en el pecho.  Nota que la frecuencia cardaca est acelerada.   Esta informacin no tiene como fin reemplazar el consejo del mdico. Asegrese de hacerle al mdico cualquier pregunta que tenga.   Document Released: 03/30/2005 Document Revised: 04/20/2014 Elsevier Interactive Patient Education 2016 Elsevier Inc.  

## 2015-05-28 MED FILL — ?LEVOTHYROXINE 50 MCG TABLE: 50 | 30 days supply | Qty: 30 | Fill #1

## 2015-05-28 MED FILL — IBUPROFEN 800 MG TABLET: 800 | 5 days supply | Qty: 20 | Fill #0

## 2015-06-12 MED FILL — LISINOPRIL-HCTZ 20-25 MG TA: 20-25 | 30 days supply | Qty: 30 | Fill #1

## 2015-07-03 ENCOUNTER — Telehealth: Payer: Self-pay | Admitting: Family Medicine

## 2015-07-03 DIAGNOSIS — E038 Other specified hypothyroidism: Secondary | ICD-10-CM

## 2015-07-03 NOTE — Telephone Encounter (Signed)
Patient called requesting a medication refill for levothyroxine. °Please follow up. ° °

## 2015-07-04 NOTE — Telephone Encounter (Signed)
Patient called requesting a medication refill for levothyroxine. °Please follow up. ° °

## 2015-07-08 MED ORDER — LEVOTHYROXINE SODIUM 50 MCG PO TABS: 50.0000 ug | ORAL_TABLET | Freq: Every day | ORAL | Status: DC

## 2015-07-08 MED FILL — LEVOTHYROXINE 50 MCG TABLET: 50 | 30 days supply | Qty: 30 | Fill #0

## 2015-07-08 NOTE — Telephone Encounter (Signed)
Refill sent.

## 2015-07-30 MED FILL — LISINOPRIL-HCTZ 20-25 MG TA: 20-25 | 30 days supply | Qty: 30 | Fill #2

## 2015-08-12 MED FILL — LEVOTHYROXINE 50 MCG TABLET: 50 | 30 days supply | Qty: 30 | Fill #1

## 2015-08-21 ENCOUNTER — Encounter: Payer: Self-pay | Admitting: Family Medicine

## 2015-08-21 ENCOUNTER — Ambulatory Visit: Payer: Self-pay | Attending: Family Medicine | Admitting: Family Medicine

## 2015-08-21 VITALS — BP 128/88 | HR 102 | Temp 98.3°F | Resp 18 | Ht <= 58 in | Wt 163.6 lb

## 2015-08-21 DIAGNOSIS — Z79899 Other long term (current) drug therapy: Secondary | ICD-10-CM | POA: Insufficient documentation

## 2015-08-21 DIAGNOSIS — R000 Tachycardia, unspecified: Secondary | ICD-10-CM | POA: Insufficient documentation

## 2015-08-21 DIAGNOSIS — E039 Hypothyroidism, unspecified: Secondary | ICD-10-CM | POA: Insufficient documentation

## 2015-08-21 DIAGNOSIS — E038 Other specified hypothyroidism: Secondary | ICD-10-CM

## 2015-08-21 DIAGNOSIS — I10 Essential (primary) hypertension: Secondary | ICD-10-CM | POA: Insufficient documentation

## 2015-08-21 MED ORDER — LISINOPRIL-HYDROCHLOROTHIAZIDE 20-25 MG PO TABS: 1.0000 | ORAL_TABLET | Freq: Every day | ORAL | Status: DC

## 2015-08-21 NOTE — Patient Instructions (Signed)
Hipotiroidismo (Hypothyroidism) El hipotiroidismo es un trastorno de la tiroides, una glndula grande ubicada en la parte anterior e inferior del cuello. La tiroides libera hormonas que controlan el funcionamiento del organismo. En los casos de hipotiroidismo, la glndula no produce la cantidad suficiente de estas hormonas. CAUSAS Las causas del hipotiroidismo pueden incluir lo siguiente:  Infecciones virales.  Embarazo.  Un ataque del sistema de defensa (sistema inmunitario) a la tiroides.  Algunos medicamentos.  Defectos de nacimiento.  Radioterapias anteriores en la cabeza o el cuello.  Tratamiento previo con yodo radioactivo.  Extirpacin quirrgica previa de una parte o de toda la tiroides.  Problemas con la glndula ubicada en el centro del cerebro (hipfisis). SIGNOS Y SNTOMAS Los signos y los sntomas de hipotiroidismo pueden ser los siguientes:  Sensacin de falta de energa (letargo).  Incapacidad para tolerar el fro.  Aumento de peso que no puede explicarse por un cambio en la dieta o en los hbitos de ejercicio fsico.  Piel seca.  Pelo grueso.  Irregularidades menstruales.  Ralentizacin de los procesos de pensamiento.  Estreimiento.  Tristeza o depresin. DIAGNSTICO  El mdico puede diagnosticar el hipotiroidismo con anlisis de sangre y ecografas. TRATAMIENTO El hipotiroidismo se trata con medicamentos que reemplazan las hormonas que el cuerpo no produce. Despus de comenzar el tratamiento, pueden pasar varias semanas hasta la desaparicin de los sntomas. INSTRUCCIONES PARA EL CUIDADO EN EL HOGAR   Tome los medicamentos solamente como se lo haya indicado el mdico.  Si empieza a tomar medicamentos nuevos, infrmele al mdico.  Concurra a todas las visitas de control como se lo haya indicado el mdico. Esto es importante. A medida que la enfermedad mejora, es posible que haya que modificar las dosis. Tendr que hacerse anlisis de sangre  peridicamente, de modo que el mdico pueda controlar la enfermedad. SOLICITE ATENCIN MDICA SI:  Los sntomas no mejoran con el tratamiento.  Est tomando medicamentos sustitutivos de la tiroides y:  Suda en exceso.  Siente temblores.  Est ansioso.  Baja de peso rpidamente.  No puede tolerar el calor.  Tiene cambios emocionales.  Tiene diarrea.  Se siente dbil. SOLICITE ATENCIN MDICA DE INMEDIATO SI:   Siente dolor en el pecho.  Tiene latidos cardacos irregulares o siente dolor en el pecho.  Nota que la frecuencia cardaca est acelerada.   Esta informacin no tiene como fin reemplazar el consejo del mdico. Asegrese de hacerle al mdico cualquier pregunta que tenga.   Document Released: 03/30/2005 Document Revised: 04/20/2014 Elsevier Interactive Patient Education 2016 Elsevier Inc.  

## 2015-08-21 NOTE — Progress Notes (Signed)
Patient's here for f/up HTN. Patient denies any pay today.  Patient requesting refill on Levothyroxine.

## 2015-08-21 NOTE — Progress Notes (Signed)
   Subjective:  Patient ID: Sharon Brennan, female    DOB: July 07, 1975  Age: [age] y.o. MRN: [id number]  CC: Follow-up and Hypertension   HPI Sharon Brennan is a 40 year old female who comes into the clinic for a follow-up of hypothyroidism and hypertension and has been compliant with all her medications. She has no complaints today and is requesting refills of her medication.  She is not up-to-date on her Pap smear and is willing to be scheduled for one  Outpatient Prescriptions Prior to Visit  Medication Sig Dispense Refill  . levothyroxine (SYNTHROID, LEVOTHROID) 50 MCG tablet Take 1 tablet (50 mcg total) by mouth daily before breakfast. Reported on 04/01/2015 30 tablet 1  . lisinopril-hydrochlorothiazide (PRINZIDE,ZESTORETIC) 20-25 MG tablet Take 1 tablet by mouth daily. 30 tablet 3   No facility-administered medications prior to visit.    ROS Review of Systems  Constitutional: Negative for activity change and appetite change.  HENT: Negative for sinus pressure and sore throat.   Respiratory: Negative for chest tightness, shortness of breath and wheezing.   Cardiovascular: Negative for chest pain and palpitations.  Gastrointestinal: Negative for abdominal pain, constipation and abdominal distention.  Genitourinary: Negative.   Musculoskeletal: Negative.   Psychiatric/Behavioral: Negative for behavioral problems and dysphoric mood.    Objective:  BP 128/88 mmHg  Pulse 125  Temp(Src) 98.3 F (36.8 C) (Oral)  Resp 18  Ht 4\' 10"  (1.473 m)  Wt 163 lb 9.6 oz (74.208 kg)  BMI 34.20 kg/m2  SpO2 100%  LMP 08/12/2015  BP/Weight 08/21/2015 05/01/2015 04/01/2015  Systolic BP 128 128 149  Diastolic BP 88 84 99  Wt. (Lbs) 163.6 153.8 156.8  BMI 34.2 32.15 32.78      Physical Exam  Constitutional: She is oriented to person, place, and time. She appears well-developed and well-nourished.  Cardiovascular: Normal heart sounds and intact distal pulses.  Tachycardia present.    No murmur heard. Pulmonary/Chest: Effort normal and breath sounds normal. She has no wheezes. She has no rales. She exhibits no tenderness.  Abdominal: Soft. Bowel sounds are normal. She exhibits no distension and no mass. There is no tenderness.  Musculoskeletal: Normal range of motion.  Neurological: She is alert and oriented to person, place, and time.     Assessment & Plan:   1. Essential hypertension Controlled - lisinopril-hydrochlorothiazide (PRINZIDE,ZESTORETIC) 20-25 MG tablet; Take 1 tablet by mouth daily.  Dispense: 30 tablet; Refill: 3  2. Other specified hypothyroidism We'll refill levothyroxine after thyroid panel has been obtained - TSH  3. Tachycardia Will check thyroid labs   Meds ordered this encounter  Medications  . lisinopril-hydrochlorothiazide (PRINZIDE,ZESTORETIC) 20-25 MG tablet    Sig: Take 1 tablet by mouth daily.    Dispense:  30 tablet    Refill:  3    Follow-up: Return in about 3 weeks (around 09/11/2015) for complete physical exam and PAP smear.   Jaclyn ShaggyEnobong Amao MD

## 2015-08-22 LAB — TSH: TSH: 3.12 mIU/L

## 2015-08-26 ENCOUNTER — Telehealth: Payer: Self-pay

## 2015-08-26 NOTE — Telephone Encounter (Signed)
Contacted patient, patient verified name and DOB. Patient was given lab results verbalize understanding with no further questions. 

## 2015-08-26 NOTE — Telephone Encounter (Signed)
[place] interpreter and spoke to [place] for lab results listed below.

## 2015-08-26 NOTE — Telephone Encounter (Signed)
-----   Message from Jaclyn ShaggyEnobong Amao, MD sent at 08/22/2015  9:05 AM EDT ----- Please inform the patient that labs are normal. Thank you.

## 2015-08-27 ENCOUNTER — Ambulatory Visit: Payer: Self-pay

## 2015-09-04 ENCOUNTER — Ambulatory Visit: Payer: Self-pay | Attending: Family Medicine

## 2015-09-11 ENCOUNTER — Emergency Department (HOSPITAL_COMMUNITY)
Admission: EM | Admit: 2015-09-11 | Discharge: 2015-09-12 | Disposition: A | Discharge status: Home / Self Care | Payer: Self-pay | Attending: Emergency Medicine | Admitting: Emergency Medicine

## 2015-09-11 ENCOUNTER — Encounter (HOSPITAL_COMMUNITY): Payer: Self-pay | Admitting: Emergency Medicine

## 2015-09-11 DIAGNOSIS — I10 Essential (primary) hypertension: Secondary | ICD-10-CM | POA: Insufficient documentation

## 2015-09-11 DIAGNOSIS — Z3202 Encounter for pregnancy test, result negative: Secondary | ICD-10-CM | POA: Insufficient documentation

## 2015-09-11 DIAGNOSIS — N390 Urinary tract infection, site not specified: Secondary | ICD-10-CM | POA: Insufficient documentation

## 2015-09-11 LAB — CBC
HCT: 41 % (ref 36.0–46.0)
HEMOGLOBIN: 13.6 g/dL (ref 12.0–15.0)
MCH: 30 pg (ref 26.0–34.0)
MCHC: 33.2 g/dL (ref 30.0–36.0)
MCV: 90.3 fL (ref 78.0–100.0)
Platelets: 257 10*3/uL (ref 150–400)
RBC: 4.54 MIL/uL (ref 3.87–5.11)
RDW: 12.7 % (ref 11.5–15.5)
WBC: 13.6 10*3/uL — AB (ref 4.0–10.5)

## 2015-09-11 LAB — COMPREHENSIVE METABOLIC PANEL
ALK PHOS: 107 U/L (ref 38–126)
ALT: 26 U/L (ref 14–54)
ANION GAP: 9 (ref 5–15)
AST: 23 U/L (ref 15–41)
Albumin: 4.2 g/dL (ref 3.5–5.0)
BUN: 15 mg/dL (ref 6–20)
CALCIUM: 9.6 mg/dL (ref 8.9–10.3)
CO2: 28 mmol/L (ref 22–32)
Chloride: 96 mmol/L — ABNORMAL LOW (ref 101–111)
Creatinine, Ser: 0.68 mg/dL (ref 0.44–1.00)
GFR calc non Af Amer: >60 mL/min (ref >60)
Glucose, Bld: 117 mg/dL — ABNORMAL HIGH (ref 65–99)
Potassium: 3.3 mmol/L — ABNORMAL LOW (ref 3.5–5.1)
SODIUM: 133 mmol/L — AB (ref 135–145)
Total Bilirubin: 0.4 mg/dL (ref 0.3–1.2)
Total Protein: 8.1 g/dL (ref 6.5–8.1)

## 2015-09-11 LAB — URINALYSIS, ROUTINE W REFLEX MICROSCOPIC
Bilirubin Urine: NEGATIVE
Glucose, UA: NEGATIVE mg/dL
Ketones, ur: NEGATIVE mg/dL
NITRITE: NEGATIVE
Protein, ur: 30 mg/dL — AB
SPECIFIC GRAVITY, URINE: 1.016 (ref 1.005–1.030)
pH: 6 (ref 5.0–8.0)

## 2015-09-11 LAB — URINE MICROSCOPIC-ADD ON

## 2015-09-11 LAB — POC URINE PREG, ED: PREG TEST UR: NEGATIVE

## 2015-09-11 LAB — LIPASE, BLOOD: LIPASE: 26 U/L (ref 11–51)

## 2015-09-11 NOTE — ED Notes (Signed)
C/o pelvic pain/ "c-section area" since 2pm today.  States she had pain Saturday but it resolved.  Denies urinary complaints.  Denies nausea and vomiting.

## 2015-09-12 MED ORDER — CEPHALEXIN 250 MG PO CAPS
500.0000 mg | ORAL_CAPSULE | Freq: Once | ORAL | Status: AC
  Administered 2015-09-12: 500 mg via ORAL
  Filled 2015-09-12: qty 2

## 2015-09-12 MED ORDER — NAPROXEN 250 MG PO TABS
500.0000 mg | ORAL_TABLET | Freq: Once | ORAL | Status: AC
  Administered 2015-09-12: 500 mg via ORAL
  Filled 2015-09-12: qty 2

## 2015-09-12 MED ORDER — CEPHALEXIN 500 MG PO CAPS: 500.0000 mg | ORAL_CAPSULE | Freq: Four times a day (QID) | ORAL | Status: DC

## 2015-09-12 MED ORDER — NAPROXEN 500 MG PO TABS: 500.0000 mg | ORAL_TABLET | Freq: Two times a day (BID) | ORAL | Status: DC

## 2015-09-12 MED FILL — CEPHALEXIN 500 MG CAPSULE: 500 | 10 days supply | Qty: 40 | Fill #0

## 2015-09-12 MED FILL — LISINOPRIL-HCTZ 20-25 MG TA: 20-25 | 30 days supply | Qty: 30 | Fill #0

## 2015-09-12 MED FILL — NAPROXEN 500 MG TABLET: 500 | 15 days supply | Qty: 30 | Fill #0

## 2015-09-12 NOTE — Discharge Instructions (Signed)
Stay very well hydrated with plenty of water throughout the day. Please take antibiotic (keflex) until completion. Use naproxen as needed for pain. Keep your scheduled doctor's appointments.   Please seek immediate care if you develop the following: Your symptoms are no better or worse in 3 days. There is severe back pain or lower abdominal pain.  You develop chills.  You have a fever.  There is nausea or vomiting.  There is continued burning or discomfort with urination.

## 2015-09-12 NOTE — ED Provider Notes (Signed)
CSN: [id number]     Arrival date & time 09/11/15  2047 History   First MD Initiated Contact with Patient 09/11/15 2345     Chief Complaint  Patient presents with  . Pelvic Pain     (Consider location/radiation/quality/duration/timing/severity/associated sxs/prior Treatment) The history is provided by the patient and medical records. A language interpreter was used Multimedia programmer).   Sharon Brennan is a 40 y.o. female  with a PMH of HTN who presents to the Emergency Department complaining of worsening suprapubic abdominal pain x 2 days. Associated symptoms include dysuria and urinary frequency. Also admits to aching low back pain. Denies fever, vaginal discharge, n/v. Patient is married with one sexual partner whom she trusts is faithful and is not concerned for sexually transmitted diseases. No medications taken PTA for symptoms.   Past Medical History  Diagnosis Date  . Hypertension    Past Surgical History  Procedure Laterality Date  . Cesarean section     No family history on file. Social History  Substance Use Topics  . Smoking status: Never Smoker   . Smokeless tobacco: None  . Alcohol Use: No   OB History    No data available     Review of Systems  Constitutional: Negative for fever and chills.  HENT: Negative for congestion.   Eyes: Negative for visual disturbance.  Respiratory: Negative for cough and shortness of breath.   Cardiovascular: Negative.   Gastrointestinal: Positive for abdominal pain. Negative for nausea and vomiting.  Genitourinary: Positive for dysuria and frequency. Negative for vaginal discharge.  Musculoskeletal: Positive for back pain. Negative for neck pain.  Skin: Negative for rash.  Neurological: Negative for dizziness and headaches.      Allergies  Review of patient's allergies indicates no known allergies.  Home Medications   Prior to Admission medications   Medication Sig Start Date End Date Taking? Authorizing Provider   cephALEXin (KEFLEX) 500 MG capsule Take 1 capsule (500 mg total) by mouth 4 (four) times daily. 09/12/15   Chase Picket Jamarquis Crull, PA-C  naproxen (NAPROSYN) 500 MG tablet Take 1 tablet (500 mg total) by mouth 2 (two) times daily. 09/12/15   Blimie Vaness Pilcher Chanette Demo, PA-C   BP 114/79 mmHg  Pulse 89  Temp(Src) 97.8 F (36.6 C) (Oral)  Resp 18  SpO2 100%  LMP 08/16/2015 Physical Exam  Constitutional: She is oriented to person, place, and time. She appears well-developed and well-nourished.  Alert and in no acute distress  HENT:  Head: Normocephalic and atraumatic.  Cardiovascular: Normal rate, regular rhythm, normal heart sounds and intact distal pulses.  Exam reveals no gallop and no friction rub.   No murmur heard. Pulmonary/Chest: Effort normal and breath sounds normal. No respiratory distress. She has no wheezes. She has no rales. She exhibits no tenderness.  Abdominal: Soft. Bowel sounds are normal. She exhibits no distension and no mass. There is tenderness (Suprapubic). There is no rebound and no guarding.  No CVA tenderness.   Musculoskeletal: Normal range of motion.  No TTP of back.   Neurological: She is alert and oriented to person, place, and time.  Skin: Skin is warm and dry.  Nursing note and vitals reviewed.   ED Course  Procedures (including critical care time) Labs Review Labs Reviewed  COMPREHENSIVE METABOLIC PANEL - Abnormal; Notable for the following:    Sodium 133 (*)    Potassium 3.3 (*)    Chloride 96 (*)    Glucose, Bld 117 (*)    All other  components within normal limits  CBC - Abnormal; Notable for the following:    WBC 13.6 (*)    All other components within normal limits  URINALYSIS, ROUTINE W REFLEX MICROSCOPIC (NOT AT [facility]) - Abnormal; Notable for the following:    APPearance CLOUDY (*)    Hgb urine dipstick MODERATE (*)    Protein, ur 30 (*)    Leukocytes, UA LARGE (*)    All other components within normal limits  URINE MICROSCOPIC-ADD ON - Abnormal;  Notable for the following:    Squamous Epithelial / LPF 0-5 (*)    Bacteria, UA MANY (*)    All other components within normal limits  LIPASE, BLOOD  POC URINE PREG, ED    Imaging Review No results found. I have personally reviewed and evaluated these images and lab results as part of my medical decision-making.   EKG Interpretation None      MDM   Final diagnoses:  UTI (lower urinary tract infection)   Sharon Brennan presents to ED for suprapubic abdominal pain x 2 days. Interpreter phone used. Patient is afebrile with reassuring vitals. Suprapubic tenderness on exam. Nonsurgical abdomen, no CVA tenderness. UA obtained which shows large amount of leuks and too numerous to count white cells. Lipase wdl. Upreg negative. CBC and CMP reassuring. Offered patient a pelvic exam which she declined stating that she is not concerned with STDs. Will treat UTI with keflex. Return precautions given. Follow up care discussed. All questions answered.    [facility] HospitalJaime Pilcher Itzell Bendavid, PA-C 09/12/15 0031  Dione Boozeavid Glick, MD 09/12/15 [phone]

## 2015-09-19 MED FILL — ?LEVOTHYROXINE 50 MCG TABLE: 50 | 30 days supply | Qty: 30 | Fill #1

## 2015-10-29 ENCOUNTER — Telehealth: Payer: Self-pay | Admitting: Family Medicine

## 2015-10-29 DIAGNOSIS — E038 Other specified hypothyroidism: Secondary | ICD-10-CM

## 2015-10-29 MED ORDER — LEVOTHYROXINE SODIUM 50 MCG PO TABS
50.0000 ug | ORAL_TABLET | Freq: Every day | ORAL | Status: DC
Start: 2015-10-29 — End: 2016-03-04

## 2015-10-29 MED FILL — LISINOPRIL-HCTZ 20-25 MG TA: 20-25 | 30 days supply | Qty: 30 | Fill #1

## 2015-10-29 MED FILL — ?LEVOTHYROXINE 50 MCG TABLE: 50 | 30 days supply | Qty: 30 | Fill #0

## 2015-10-29 NOTE — Telephone Encounter (Signed)
Levothyroxine refilled

## 2015-10-29 NOTE — Telephone Encounter (Signed)
Patient called requesting medication refill on levothyroxine (SYNTHROID, LEVOTHROID) 50 MCG tablet , please f/up

## 2015-10-31 ENCOUNTER — Encounter: Payer: Self-pay | Admitting: Family Medicine

## 2015-12-10 MED FILL — LISINOPRIL-HCTZ 20-25 MG TA: 20-25 | 30 days supply | Qty: 30 | Fill #2

## 2015-12-10 MED FILL — LEVOTHYROXINE 50 MCG TABLET: 50 | 30 days supply | Qty: 30 | Fill #1

## 2016-01-14 MED FILL — ?LEVOTHYROXINE 50 MCG TABLE: 50 | 30 days supply | Qty: 30 | Fill #2

## 2016-01-16 MED FILL — LISINOPRIL-HCTZ 20-25 MG TA: 20-25 | 30 days supply | Qty: 30 | Fill #3

## 2016-01-17 ENCOUNTER — Encounter: Payer: Self-pay | Admitting: Family Medicine

## 2016-01-27 ENCOUNTER — Encounter: Payer: Self-pay | Admitting: Family Medicine

## 2016-02-24 MED FILL — LISINOPRIL-HCTZ 20-25 MG TA: 20-25 | 30 days supply | Qty: 30 | Fill #3

## 2016-02-24 MED FILL — LEVOTHYROXINE 50 MCG TABLET: 50 | 30 days supply | Qty: 30 | Fill #3

## 2016-03-04 ENCOUNTER — Encounter: Payer: Self-pay | Admitting: Family Medicine

## 2016-03-04 ENCOUNTER — Ambulatory Visit: Payer: Self-pay | Attending: Family Medicine | Admitting: Family Medicine

## 2016-03-04 ENCOUNTER — Ambulatory Visit: Payer: Self-pay

## 2016-03-04 DIAGNOSIS — Z1231 Encounter for screening mammogram for malignant neoplasm of breast: Secondary | ICD-10-CM

## 2016-03-04 DIAGNOSIS — Z91048 Other nonmedicinal substance allergy status: Secondary | ICD-10-CM | POA: Insufficient documentation

## 2016-03-04 DIAGNOSIS — I10 Essential (primary) hypertension: Secondary | ICD-10-CM | POA: Insufficient documentation

## 2016-03-04 DIAGNOSIS — E038 Other specified hypothyroidism: Secondary | ICD-10-CM | POA: Insufficient documentation

## 2016-03-04 DIAGNOSIS — Z124 Encounter for screening for malignant neoplasm of cervix: Secondary | ICD-10-CM | POA: Insufficient documentation

## 2016-03-04 DIAGNOSIS — Z23 Encounter for immunization: Secondary | ICD-10-CM

## 2016-03-04 DIAGNOSIS — Z1239 Encounter for other screening for malignant neoplasm of breast: Secondary | ICD-10-CM

## 2016-03-04 DIAGNOSIS — Z0000 Encounter for general adult medical examination without abnormal findings: Secondary | ICD-10-CM

## 2016-03-04 LAB — COMPREHENSIVE METABOLIC PANEL
ALBUMIN: 4.3 g/dL (ref 3.6–5.1)
ALK PHOS: 94 U/L (ref 33–115)
ALT: 17 U/L (ref 6–29)
AST: 16 U/L (ref 10–30)
BILIRUBIN TOTAL: 0.4 mg/dL (ref 0.2–1.2)
BUN: 11 mg/dL (ref 7–25)
CALCIUM: 8.9 mg/dL (ref 8.6–10.2)
CO2: 25 mmol/L (ref 20–31)
CREATININE: 0.55 mg/dL (ref 0.50–1.10)
Chloride: 104 mmol/L (ref 98–110)
GLUCOSE: 109 mg/dL — AB (ref 65–99)
Potassium: 3.9 mmol/L (ref 3.5–5.3)
SODIUM: 138 mmol/L (ref 135–146)
Total Protein: 7.3 g/dL (ref 6.1–8.1)

## 2016-03-04 LAB — LIPID PANEL
CHOLESTEROL: 174 mg/dL (ref <200)
HDL: 58 mg/dL (ref >50)
LDL CALC: 97 mg/dL (ref <100)
TRIGLYCERIDES: 96 mg/dL (ref <150)
Total CHOL/HDL Ratio: 3 Ratio (ref <5.0)
VLDL: 19 mg/dL (ref <30)

## 2016-03-04 LAB — TSH: TSH: 2.23 mIU/L

## 2016-03-04 MED ORDER — LEVOTHYROXINE SODIUM 50 MCG PO TABS: 50.0000 ug | ORAL_TABLET | Freq: Every day | ORAL | 5 refills | Status: DC

## 2016-03-04 MED ORDER — LABETALOL HCL 200 MG PO TABS: 200.0000 mg | ORAL_TABLET | Freq: Two times a day (BID) | ORAL | 5 refills | Status: DC

## 2016-03-04 MED FILL — LABETALOL HCL 200 MG TABLET: 200 | 30 days supply | Qty: 60 | Fill #0

## 2016-03-04 NOTE — Patient Instructions (Signed)
Mantenimiento de la salud - Mujeres (Health Maintenance, Female) Un estilo de vida saludable y los cuidados preventivos pueden favorecer considerablemente a la salud y el bienestar. Pregunte a su mdico cul es el cronograma de exmenes peridicos apropiado para usted. Esta es una buena oportunidad para consultarlo sobre cmo prevenir enfermedades y mantenerse sano. Adems de los controles, hay muchas otras cosas que puede hacer usted mismo. Los expertos han realizado numerosas investigaciones sobre los cambios en el estilo de vida y las medidas de prevencin que, muy probablemente, lo ayudarn a mantenerse sano. Solicite a su mdico ms informacin. EL PESO Y LA DIETA Consuma una dieta saludable.   Asegrese de incluir muchas verduras, frutas, productos lcteos de bajo contenido de grasa y protenas magras.  No consuma muchos alimentos de alto contenido de grasas slidas, azcares agregados o sal.  Realice actividad fsica con regularidad. Esta es una de las prcticas ms importantes que puede hacer por su salud.  La mayora de los adultos deben hacer ejercicio durante al menos 150minutos por semana. El ejercicio debe aumentar la frecuencia cardaca y provocar la transpiracin (ejercicio de intensidad moderada).  La mayora de los adultos tambin deben hacer ejercicios de elongacin al menos dos veces a la semana. Agregue esto al su plan de ejercicio de intensidad moderada. Mantenga un peso saludable.   El ndice de masa corporal (IMC) es una medida que puede utilizarse para identificar posibles problemas de peso. Proporciona una estimacin de la grasa corporal basndose en el peso y la altura. Su mdico puede ayudarle a determinar su IMC y a lograr o mantener un peso saludable.  Para las mujeres de 20aos o ms:  Un IMC menor de 18,5 se considera bajo peso.  Un IMC entre 18,5 y 24,9 es normal.  Un IMC entre 25 y 29,9 se considera sobrepeso.  Un IMC de 30 o ms se considera  obesidad. Observe los niveles de colesterol y lpidos en la sangre.   Debe comenzar a realizarse anlisis de lpidos y colesterol en la sangre a los 20aos y luego repetirlos cada 5aos.  Es posible que necesite controlar los niveles de colesterol con mayor frecuencia si:  Sus niveles de lpidos y colesterol son altos.  Es mayor de 50aos.  Presenta un alto riesgo de padecer enfermedades cardacas. DETECCIN DE CNCER Cncer de pulmn   Se recomienda realizar exmenes de deteccin de cncer de pulmn a personas adultas entre 55 y 80 aos que estn en riesgo de desarrollar cncer de pulmn por sus antecedentes de consumo de tabaco.  Se recomienda una tomografa computarizada de baja dosis de los pulmones todos los aos a las personas que:  Fuman actualmente.  Hayan dejado el hbito en algn momento en los ltimos 15aos.  Hayan fumado durante 30aos un paquete diario. Un paquete-ao equivale a fumar un promedio de un paquete de cigarrillos diario durante un ao.  Los exmenes de deteccin anuales deben continuar hasta que hayan pasado 15aos desde que dej de fumar.  Ya no debern realizarse si tiene un problema de salud que le impida recibir tratamiento para el cncer de pulmn. Cncer de mama   Practique la autoconciencia de la mama. Esto significa reconocer la apariencia normal de sus mamas y cmo las siente.  Tambin significa realizar autoexmenes regulares de las mamas. Informe a su mdico sobre cualquier cambio, sin importar cun pequeo sea.  Si tiene entre 20 y 30 aos, un mdico debe realizarle un examen clnico de las mamas como parte del examen regular   de salud, cada 1 a 3aos.  Si tiene 40aos o ms, debe Information systems manager clnico de las [employer]. Tambin considere realizarse una [place]) todos los [place].  Si tiene antecedentes familiares de cncer de mama, hable con su mdico para someterse a un estudio  gentico.  Si tiene alto riesgo de Chief Financial Officer de mama, hable con su mdico para someterse a Public house manager y [employer].  La evaluacin del gen del cncer de mama (BRCA) se recomienda a mujeres que tengan familiares con cnceres relacionados con el BRCA. Los cnceres relacionados con el BRCA incluyen los siguientes:  Mama.  Ovario.  Trompas.  Cnceres de peritoneo.  Los resultados de la evaluacin determinarn la necesidad de asesoramiento gentico y de [place] de BRCA1 y BRCA2. Cncer de cuello del tero  El mdico puede recomendarle que se haga pruebas peridicas de deteccin de cncer de los rganos de la pelvis (ovarios, tero y vagina). Estas pruebas incluyen un examen plvico, que abarca controlar si se produjeron cambios microscpicos en la superficie del cuello del tero (prueba de Papanicolaou). Pueden recomendarle que se haga estas pruebas cada 3aos, a partir de los 21aos.  A las mujeres que tienen entre 30 y 53aos, los mdicos pueden recomendarles que se sometan a exmenes plvicos y pruebas de Papanicolaou cada 5aos, o a la prueba de Papanicolaou y el examen plvico en combinacin con estudios de deteccin del virus del papiloma humano (VPH) cada 5aos. Algunos tipos de VPH aumentan el riesgo de Chief Financial Officer de cuello del tero. La prueba para la deteccin del VPH tambin puede realizarse a mujeres de cualquier edad cuyos resultados de la prueba de Papanicolaou no sean claros.  Es posible que otros mdicos no recomienden exmenes de deteccin a mujeres no embarazadas que se consideran sujetos de bajo riesgo de Chief Financial Officer de pelvis y que no tienen sntomas. Pregntele al mdico si un examen plvico de deteccin es adecuado para usted.  Si ha recibido un tratamiento para Science writer cervical o una enfermedad que podra causar cncer, necesitar realizarse una prueba de Papanicolaou y controles durante al menos 3 aos de concluido el  [place]. Si no se ha hecho el Papanicolaou con regularidad, debern volver a evaluarse los factores de riesgo (como tener un nuevo compaero sexual), para Teacher, adult education si debe realizarse los estudios nuevamente. Algunas mujeres sufren problemas mdicos que aumentan la probabilidad de Museum/gallery curator cncer de cuello del tero. En estos casos, el mdico podr [employer] se realicen controles y pruebas de Papanicolaou con ms frecuencia. Cncer colorrectal   Este tipo de cncer puede detectarse y a menudo prevenirse.  Por lo general, los estudios de rutina se deben Medical laboratory scientific officer a Field seismologist a Proofreader de los 17 aos y [place] 64 aos.  Sin embargo, el mdico podr aconsejarle que lo haga antes, si tiene factores de riesgo para el cncer de colon.  Tambin puede recomendarle que use un kit de prueba para Hydrologist en la materia fecal.  Es posible que se use una pequea cmara en el extremo de un tubo para examinar directamente el colon (sigmoidoscopia o colonoscopia) a fin de Hydrographic surveyor formas tempranas de cncer colorrectal.  Los exmenes de rutina generalmente comienzan a los 87aos.  El examen directo del colon se debe repetir cada 5 a 10aos hasta los 75aos. Sin embargo, es posible que se realicen exmenes con mayor frecuencia, si se detectan formas tempranas de plipos precancerosos o pequeos bultos.  Cncer de piel   Revise la piel de la cabeza a los pies con regularidad.  Informe a su mdico si aparecen nuevos lunares o los que tiene se modifican, especialmente en su forma y color.  Tambin notifique al mdico si tiene un lunar que es ms grande que el tamao de una goma de lpiz.  Siempre use pantalla solar. Aplique pantalla solar de [place] y repetida a lo largo del Training and development officer.  Protjase usando mangas y [employer], un sombrero de ala ancha y gafas para el sol, siempre que se encuentre en el exterior. ENFERMEDADES CARDACAS, DIABETES E HIPERTENSIN ARTERIAL  La hipertensin  arterial causa enfermedades cardacas y [place] el riesgo de ictus. La hipertensin arterial es ms probable en los siguientes casos:  Las personas que tienen la presin arterial en el extremo del rango normal (100-139/85-89 mm Hg).  Las personas con sobrepeso u obesidad.  Las Retail banker.  Si usted tiene entre 18 y 39 aos, debe medirse la presin arterial cada 3 a 5 aos. Si usted tiene 40 aos o ms, debe medirse la presin arterial [employer]. Debe medirse la presin arterial dos veces: una vez cuando est en un hospital o una clnica y la otra vez cuando est en otro sitio. Registre el promedio de [employer]. Para controlar su presin arterial cuando no est en un hospital o [place], puede usar lo siguiente:  Ardelia Mems mquina automtica para medir la presin arterial en una farmacia.  Un monitor para medir la presin arterial en el hogar.  Si tiene entre 46 y 81 aos, consulte a su mdico si debe tomar aspirina para prevenir el ictus.  Realcese exmenes de deteccin de la diabetes con regularidad. Esto incluye la toma de [place] de sangre para controlar el nivel de azcar en la sangre durante el [place].  Si tiene un peso normal y un bajo riesgo de padecer diabetes, realcese este anlisis cada tres aos despus de los 45aos.  Si tiene sobrepeso y un alto riesgo de padecer diabetes, considere someterse a este anlisis antes o con mayor frecuencia. PREVENCIN DE INFECCIONES HepatitisB   Si tiene un riesgo ms alto de Museum/gallery curator hepatitis B, debe someterse a un examen de deteccin de este virus. Se considera que tiene un alto riesgo de Museum/gallery curator hepatitis B si:  Naci en un pas donde la hepatitis B es frecuente. Pregntele a su mdico qu pases son considerados de Public affairs consultant.  Sus padres nacieron en un pas de alto riesgo y usted no recibi una vacuna que lo proteja contra la hepatitis B (vacuna contra la hepatitis B).  [place].  [place] agujas  para inyectarse drogas.  Vive con alguien que tiene hepatitis B.  Ha tenido sexo con alguien que tiene hepatitis B.  Recibe tratamiento de hemodilisis.  Toma ciertos medicamentos para el cncer, trasplante de rganos y afecciones autoinmunitarias. Hepatitis C   Se recomienda un anlisis de [place] para:  Todos los que nacieron entre 1945 y [phone].  Todas las personas que tengan un riesgo de haber contrado hepatitis C. Enfermedades de transmisin sexual (ETS).   Debe realizarse pruebas de deteccin de enfermedades de transmisin sexual (ETS), incluidas gonorrea y clamidia si:  Es sexualmente activo y es menor de 24aos.  Es mayor de 24aos, y Investment banker, operational informa que corre riesgo de tener este tipo de infecciones.  La actividad sexual ha cambiado desde que le hicieron la ltima prueba de deteccin y tiene un riesgo  mayor de tener clamidia o gonorrea. Pregntele al mdico si usted tiene riesgo.  Si no tiene el VIH, pero corre riesgo de infectarse por el virus, se recomienda tomar diariamente un medicamento recetado para evitar la infeccin. Esto se conoce como profilaxis previa a la exposicin. Se considera que est en riesgo si:  Es [place] sexualmente y no [place] preservativos habitualmente o no conoce el estado del VIH de sus Advertising copywriter.  Se inyecta drogas.  Es [place] sexualmente con Ardelia Mems pareja que tiene VIH. Consulte a su mdico para saber si tiene un alto riesgo de infectarse por el VIH. Si opta por comenzar la profilaxis previa a la exposicin, primero debe realizarse anlisis de deteccin del VIH. Luego, le harn anlisis cada 80mses mientras est tomando los medicamentos para la profilaxis previa a la exposicin. [facility] Si es premenopusica y puede quedar e[place] solicite a su mdico asesoramiento previo a la concepcin.  Si puede quedar embarazada, tome 400 a [id number]([id number]) de cido fAnheuser-Busch  Si desea evitar el embarazo, hable con su  mdico sobre el control de la natalidad (anticoncepcin). OSTEOPOROSIS Y MENOPAUSIA  La osteoporosis es una enfermedad en la que los huesos pierden los minerales y la fuerza por el avance de la edad. El resultado pueden ser fracturas graves en los h[place] El riesgo de osteoporosis puede identificarse con uArdelia Memsprueba de densidad sea.  Si tiene 65aos o ms, o si est en riesgo de sufrir osteoporosis y fracturas, pregunte a su mdico si debe someterse a exmenes.  Consulte a su mdico si debe tomar un suplemento de calcio o de vitamina D para reducir el riesgo de osteoporosis.  La menopausia puede presentar ciertos sntomas fsicos y rGaffer  La terapia de reemplazo hormonal puede reducir algunos de estos sntomas y rGaffer Consulte a su mdico para saber si la terapia de reemplazo hormonal es conveniente para usted. INSTRUCCIONES PARA EL CUIDADO EN EL HOGAR  Realcese los estudios de rutina de la salud, dentales y de lPublic librarian  [place]  No consuma ningn producto que contenga tabaco, lo que incluye cigarrillos, tabaco de mHigher education careers advisero cPsychologist, sport and exercise  Si est embarazada, no beba alcohol.  Si est amamantando, reduzca el consumo de alcohol y la frecuencia con la que consume.  Si es mujer y no est embarazada limite el consumo de alcohol a no ms de 1 medida por da. Una medida equivale a 12onzas de cerveza, 5onzas de vino o 1onzas de bebidas alcohlicas de alta graduacin.  No consuma drogas.  No comparta agujas.  Solicite ayuda a su mdico si necesita apoyo o informacin para abandonar las drogas.  Informe a su mdico si a menudo se siente deprimido.  Notifique a su mdico si alguna vez ha sido vctima de abuso o si no se siente seguro en su hogar. Esta informacin no tiene cMarine scientistel consejo del mdico. Asegrese de hacerle al mdico cualquier pregunta que tenga. Document Released: 03/19/2011 Document Revised: 04/20/2014 Document  Reviewed: 01/01/2015 Elsevier Interactive Patient Education  2017 [employer]

## 2016-03-04 NOTE — Progress Notes (Signed)
Subjective:  Patient ID: Sharon Brennan, female    DOB: 10/09/1975  Age: 40 y.o. MRN: [id number]  CC: Annual Exam and Hypertension   HPI Sharon Brennan presents for Complete physical exam She has expressed the desire to conceive and is working on this.  Past Medical History:  Diagnosis Date  . Hypertension     Past Surgical History:  Procedure Laterality Date  . CESAREAN SECTION    . CESAREAN SECTION      Allergies  Allergen Reactions  . Pollen Extract       Outpatient Medications Prior to Visit  Medication Sig Dispense Refill  . lisinopril-hydrochlorothiazide (PRINZIDE,ZESTORETIC) 20-25 MG tablet Take 1 tablet by mouth daily. 30 tablet 3  . levothyroxine (SYNTHROID, LEVOTHROID) 50 MCG tablet Take 1 tablet (50 mcg total) by mouth daily before breakfast. Reported on 04/01/2015 30 tablet 3  . cephALEXin (KEFLEX) 500 MG capsule Take 1 capsule (500 mg total) by mouth 4 (four) times daily. 40 capsule 0  . naproxen (NAPROSYN) 500 MG tablet Take 1 tablet (500 mg total) by mouth 2 (two) times daily. 30 tablet 0   No facility-administered medications prior to visit.     ROS Review of Systems  Constitutional: Negative for activity change, appetite change and fatigue.  HENT: Negative for congestion, sinus pressure and sore throat.   Eyes: Negative for visual disturbance.  Respiratory: Negative for cough, chest tightness, shortness of breath and wheezing.   Cardiovascular: Negative for chest pain and palpitations.  Gastrointestinal: Negative for abdominal distention, abdominal pain and constipation.  Endocrine: Negative for polydipsia.  Genitourinary: Negative for dysuria and frequency.  Musculoskeletal: Negative for arthralgias and back pain.  Skin: Negative for rash.  Neurological: Negative for tremors, light-headedness and numbness.  Hematological: Does not bruise/bleed easily.  Psychiatric/Behavioral: Negative for agitation and behavioral problems.     Objective:  BP 129/79 (BP Location: Right Arm, Patient Position: Sitting, Cuff Size: Small)   Pulse (!) 106   Temp 98.3 F (36.8 C) (Oral)   Ht 4\' 11"  (1.499 m)   Wt 163 lb (73.9 kg)   LMP 02/17/2016   SpO2 100%   BMI 32.92 kg/m   BP/Weight 03/04/2016 09/12/2015 08/21/2015  Systolic BP 129 118 128  Diastolic BP 79 74 88  Wt. (Lbs) 163 - 163.6  BMI 32.92 - 34.2       Physical Exam  Constitutional: She is oriented to person, place, and time. She appears well-developed and well-nourished. No distress.  HENT:  Head: Normocephalic.  Right Ear: External ear normal.  Left Ear: External ear normal.  Nose: Nose normal.  Mouth/Throat: Oropharynx is clear and moist.  Eyes: Conjunctivae and EOM are normal. Pupils are equal, round, and reactive to light.  Neck: Normal range of motion. No JVD present.  Cardiovascular: Normal rate, regular rhythm, normal heart sounds and intact distal pulses.  Exam reveals no gallop.   No murmur heard. Pulmonary/Chest: Effort normal and breath sounds normal. No respiratory distress. She has no wheezes. She has no rales. She exhibits no tenderness. Right breast exhibits no mass and no tenderness. Left breast exhibits no mass and no tenderness.  Abdominal: Soft. Bowel sounds are normal. She exhibits no distension and no mass. There is no tenderness.  Genitourinary:  Genitourinary Comments: Normal external genitalia, normal vagina, normal cervix  Musculoskeletal: Normal range of motion. She exhibits no edema or tenderness.  Neurological: She is alert and oriented to person, place, and time. She has normal reflexes.  Skin: Skin is  warm and dry. She is not diaphoretic.  Psychiatric: She has a normal mood and affect.     Assessment & Plan:   1. Routine general medical examination at a health care facility   2. Screening for cervical cancer - Cytology - PAP ([place])  3. Screening for breast cancer  - Mammogram Digital Screening; Future  4.  Essential hypertension Will switch from lisinopril/HCTZ to labetalol due to intention to conceive - Lipid panel - Comprehensive metabolic panel  5. Other specified hypothyroidism - TSH - levothyroxine (SYNTHROID, LEVOTHROID) 50 MCG tablet; Take 1 tablet (50 mcg total) by mouth daily before breakfast. Reported on 04/01/2015  Dispense: 30 tablet; Refill: 5   Meds ordered this encounter  Medications  . levothyroxine (SYNTHROID, LEVOTHROID) 50 MCG tablet    Sig: Take 1 tablet (50 mcg total) by mouth daily before breakfast. Reported on 04/01/2015    Dispense:  30 tablet    Refill:  5  . labetalol (NORMODYNE) 200 MG tablet    Sig: Take 1 tablet (200 mg total) by mouth 2 (two) times daily.    Dispense:  60 tablet    Refill:  5    Discontinue lisinopril/HCTZ    Follow-up: Return in 3 months (on 06/04/2016) for Follow-up of hypertension and hypothyroidism.   Jaclyn ShaggyEnobong Amao MD

## 2016-03-09 LAB — CYTOLOGY - PAP
Chlamydia: NEGATIVE
Diagnosis: NEGATIVE
HPV: NOT DETECTED
NEISSERIA GONORRHEA: NEGATIVE
Pap: NEGATIVE
TRICH (WINDOWPATH): NEGATIVE

## 2016-03-11 LAB — CERVICOVAGINAL ANCILLARY ONLY: CANDIDA VAGINITIS: NEGATIVE

## 2016-03-12 ENCOUNTER — Telehealth: Payer: Self-pay

## 2016-03-12 NOTE — Telephone Encounter (Signed)
Writer was able to contact patient through [employer] per Dr. Venetia NightAmao.  Lab results were discussed with patient.  Patient stated understanding.

## 2016-03-23 ENCOUNTER — Encounter: Payer: Self-pay | Admitting: Family Medicine

## 2016-03-23 ENCOUNTER — Ambulatory Visit: Payer: Self-pay | Attending: Family Medicine | Admitting: Family Medicine

## 2016-03-23 VITALS — BP 133/84 | HR 92 | Temp 98.4°F | Ht 59.0 in | Wt 160.0 lb

## 2016-03-23 DIAGNOSIS — H9202 Otalgia, left ear: Secondary | ICD-10-CM

## 2016-03-23 DIAGNOSIS — Z9109 Other allergy status, other than to drugs and biological substances: Secondary | ICD-10-CM | POA: Insufficient documentation

## 2016-03-23 DIAGNOSIS — E039 Hypothyroidism, unspecified: Secondary | ICD-10-CM | POA: Insufficient documentation

## 2016-03-23 DIAGNOSIS — H6122 Impacted cerumen, left ear: Secondary | ICD-10-CM

## 2016-03-23 DIAGNOSIS — I10 Essential (primary) hypertension: Secondary | ICD-10-CM | POA: Insufficient documentation

## 2016-03-23 MED ORDER — CIPROFLOXACIN-DEXAMETHASONE 0.3-0.1 % OT SUSP: 4.0000 [drp] | Freq: Two times a day (BID) | OTIC | 0 refills | Status: DC

## 2016-03-23 MED FILL — LEVOTHYROXINE 50 MCG TABLET: 50 | 30 days supply | Qty: 30 | Fill #0

## 2016-03-23 MED FILL — CIPRODEX OTIC SUSPENSION: 0.3-0.1 | 15 days supply | Qty: 8 | Fill #0

## 2016-03-23 NOTE — Progress Notes (Signed)
   Subjective:  Patient ID: Sharon Brennan, female    DOB: November 17, 1975  Age: 40 y.o. MRN: [id number]  CC: Otalgia (left side) and Hypertension   HPI Sharon Brennan is a 40 year old female with a history of hypertension, hypothyroidism who presents with a one-week history of left otalgia. She denies upper respiratory symptoms, sinus congestion or fevers. Denies hearing loss but endorses some tinnitus.   Past Medical History:  Diagnosis Date  . Hypertension     Past Surgical History:  Procedure Laterality Date  . CESAREAN SECTION    . CESAREAN SECTION      Allergies  Allergen Reactions  . Pollen Extract       Outpatient Medications Prior to Visit  Medication Sig Dispense Refill  . labetalol (NORMODYNE) 200 MG tablet Take 1 tablet (200 mg total) by mouth 2 (two) times daily. 60 tablet 5  . levothyroxine (SYNTHROID, LEVOTHROID) 50 MCG tablet Take 1 tablet (50 mcg total) by mouth daily before breakfast. Reported on 04/01/2015 30 tablet 5  . lisinopril-hydrochlorothiazide (PRINZIDE,ZESTORETIC) 20-25 MG tablet Take 1 tablet by mouth daily. (Patient not taking: Reported on 03/23/2016) 30 tablet 3   No facility-administered medications prior to visit.     ROS Review of Systems  Constitutional: Negative for activity change and appetite change.  HENT: Positive for ear pain. Negative for sinus pressure and sore throat.   Respiratory: Negative for chest tightness, shortness of breath and wheezing.   Cardiovascular: Negative for chest pain and palpitations.  Gastrointestinal: Negative for abdominal distention, abdominal pain and constipation.  Genitourinary: Negative.   Musculoskeletal: Negative.   Psychiatric/Behavioral: Negative for behavioral problems and dysphoric mood.    Objective:  BP 133/84 (BP Location: Right Arm, Patient Position: Sitting, Cuff Size: Small)   Pulse 92   Temp 98.4 F (36.9 C) (Oral)   Ht 4\' 11"  (1.499 m)   Wt 160 lb (72.6 kg)   LMP  02/17/2016   SpO2 100%   BMI 32.32 kg/m   BP/Weight 03/23/2016 03/04/2016 09/12/2015  Systolic BP 133 129 118  Diastolic BP 84 79 74  Wt. (Lbs) 160 163 -  BMI 32.32 32.92 -      Physical Exam  Constitutional: She is oriented to person, place, and time. She appears well-developed and well-nourished.  HENT:  Cerumen obscuring left TM Right TM is normal  Cardiovascular: Normal rate, normal heart sounds and intact distal pulses.   No murmur heard. Pulmonary/Chest: Effort normal and breath sounds normal. She has no wheezes. She has no rales. She exhibits no tenderness.  Abdominal: Soft. Bowel sounds are normal. She exhibits no distension and no mass. There is no tenderness.  Musculoskeletal: Normal range of motion.  Neurological: She is alert and oriented to person, place, and time.    Assessment & Plan:   1. Left ear pain - ciprofloxacin-dexamethasone (CIPRODEX) otic suspension; Place 4 drops into the left ear 2 (two) times daily.  Dispense: 7.5 mL; Refill: 0  2. Cerumen debris on tympanic membrane of left ear Advised to use OTC ceruminolytics   Meds ordered this encounter  Medications  . ciprofloxacin-dexamethasone (CIPRODEX) otic suspension    Sig: Place 4 drops into the left ear 2 (two) times daily.    Dispense:  7.5 mL    Refill:  0    Follow-up: Return in about 2 months (around 05/24/2016) for follow up on Hypertension.   Jaclyn ShaggyEnobong Amao MD

## 2016-05-05 MED FILL — LEVOTHYROXINE 50 MCG TABLET: 50 | 30 days supply | Qty: 30 | Fill #1

## 2016-05-21 MED FILL — LABETALOL HCL 200 MG TABLET: 200 | 7 days supply | Qty: 15 | Fill #1

## 2016-05-26 MED FILL — ?LEVOTHYROXINE 50 MCG TABLE: 50 | 30 days supply | Qty: 30 | Fill #2

## 2016-06-23 ENCOUNTER — Other Ambulatory Visit: Payer: Self-pay | Admitting: Family Medicine

## 2016-06-23 DIAGNOSIS — I10 Essential (primary) hypertension: Secondary | ICD-10-CM

## 2016-06-23 MED FILL — ?LEVOTHYROXINE 50 MCG TABLE: 50 | 30 days supply | Qty: 30 | Fill #3

## 2016-06-24 ENCOUNTER — Other Ambulatory Visit: Payer: Self-pay | Admitting: Family Medicine

## 2016-06-24 DIAGNOSIS — I10 Essential (primary) hypertension: Secondary | ICD-10-CM

## 2016-06-26 ENCOUNTER — Other Ambulatory Visit: Payer: Self-pay | Admitting: Family Medicine

## 2016-06-26 DIAGNOSIS — I10 Essential (primary) hypertension: Secondary | ICD-10-CM

## 2016-06-29 MED FILL — LABETALOL HCL 200 MG TABLET: 200 | 7 days supply | Qty: 15 | Fill #2

## 2016-07-08 ENCOUNTER — Other Ambulatory Visit: Payer: Self-pay | Admitting: Family Medicine

## 2016-07-22 MED FILL — LABETALOL HCL 200 MG TABLET: 200 | 15 days supply | Qty: 30 | Fill #3

## 2016-07-24 MED FILL — ?LEVOTHYROXINE 50 MCG TABLE: 50 | 30 days supply | Qty: 30 | Fill #4

## 2016-07-28 MED FILL — LABETALOL HCL 200 MG TABLET: 200 | 15 days supply | Qty: 30 | Fill #4

## 2016-08-11 ENCOUNTER — Encounter: Payer: Self-pay | Admitting: Family Medicine

## 2016-08-11 ENCOUNTER — Ambulatory Visit: Payer: Self-pay | Attending: Family Medicine | Admitting: Family Medicine

## 2016-08-11 VITALS — BP 137/87 | HR 96 | Temp 98.4°F | Resp 18 | Ht <= 58 in | Wt 165.4 lb

## 2016-08-11 DIAGNOSIS — I10 Essential (primary) hypertension: Secondary | ICD-10-CM | POA: Insufficient documentation

## 2016-08-11 DIAGNOSIS — E038 Other specified hypothyroidism: Secondary | ICD-10-CM | POA: Insufficient documentation

## 2016-08-11 MED ORDER — LABETALOL HCL 200 MG PO TABS: 200.0000 mg | ORAL_TABLET | Freq: Two times a day (BID) | ORAL | 5 refills | Status: DC

## 2016-08-11 MED ORDER — LEVOTHYROXINE SODIUM 50 MCG PO TABS: 50.0000 ug | ORAL_TABLET | Freq: Every day | ORAL | 5 refills | Status: DC

## 2016-08-11 NOTE — Progress Notes (Signed)
Subjective:  Patient ID: Sharon Brennan, female    DOB: [birth year]  Age: 41 y.o. MRN: [id number]  CC: Medication Refill (Levothyroxine; Labetalol HCL )   HPI Sharon Brennan is a 41 year old female who presents for follow-up on hypertension and hypothyroidism. She has been compliant with her medications and exercise as well as low-sodium diet and is requesting refills today.  She has been trying to conceive for the last 4 months and is still hoping she can. She has no additional concerns today.   Past Medical History:  Diagnosis Date  . Hypertension     Past Surgical History:  Procedure Laterality Date  . CESAREAN SECTION    . CESAREAN SECTION       Outpatient Medications Prior to Visit  Medication Sig Dispense Refill  . labetalol (NORMODYNE) 200 MG tablet Take 1 tablet (200 mg total) by mouth 2 (two) times daily. 60 tablet 5  . levothyroxine (SYNTHROID, LEVOTHROID) 50 MCG tablet Take 1 tablet (50 mcg total) by mouth daily before breakfast. Reported on 04/01/2015 30 tablet 5  . ciprofloxacin-dexamethasone (CIPRODEX) otic suspension Place 4 drops into the left ear 2 (two) times daily. (Patient not taking: Reported on 08/11/2016) 7.5 mL 0   No facility-administered medications prior to visit.     ROS Review of Systems  Constitutional: Negative for activity change, appetite change and fatigue.  HENT: Negative for congestion, sinus pressure and sore throat.   Eyes: Negative for visual disturbance.  Respiratory: Negative for cough, chest tightness, shortness of breath and wheezing.   Cardiovascular: Negative for chest pain and palpitations.  Gastrointestinal: Negative for abdominal distention, abdominal pain and constipation.  Endocrine: Negative for polydipsia.  Genitourinary: Negative for dysuria and frequency.  Musculoskeletal: Negative for arthralgias and back pain.  Skin: Negative for rash.  Neurological: Negative for tremors, light-headedness and numbness.    Hematological: Does not bruise/bleed easily.  Psychiatric/Behavioral: Negative for agitation and behavioral problems.    Objective:  BP 137/87 (BP Location: Right Arm, Patient Position: Sitting, Cuff Size: Large)   Pulse 96   Temp 98.4 F (36.9 C) (Oral)   Resp 18   Ht 4' 10" (1.473 m)   Wt 165 lb 6.4 oz (75 kg)   LMP 07/26/2016 (Exact Date)   SpO2 100%   BMI 34.57 kg/m   BP/Weight 08/11/2016 03/23/2016 95/18/8416  Systolic BP 606 301 601  Diastolic BP 87 84 79  Wt. (Lbs) 165.4 160 163  BMI 34.57 32.32 32.92      Physical Exam  Constitutional: She is oriented to person, place, and time. She appears well-developed and well-nourished.  Cardiovascular: Normal rate, normal heart sounds and intact distal pulses.   No murmur heard. Pulmonary/Chest: Effort normal and breath sounds normal. She has no wheezes. She has no rales. She exhibits no tenderness.  Abdominal: Soft. Bowel sounds are normal. She exhibits no distension and no mass. There is no tenderness.  Musculoskeletal: Normal range of motion.  Neurological: She is alert and oriented to person, place, and time.    Lab Results  Component Value Date   TSH 2.23 03/04/2016    CMP Latest Ref Rng & Units 03/04/2016 09/11/2015 04/01/2015  Glucose 65 - 99 mg/dL 109(H) 117(H) 93  BUN 7 - 25 mg/dL _0 Creatinine 0.50 - 1.10 mg/dL 0.55 0.68 0.44(L)  Sodium 135 - 146 mmol/L 138 133(L) 139  Potassium 3.5 - 5.3 mmol/L 3.9 3.3(L) 3.8  Chloride 98 - 110 mmol/L 104 96(L) 99  CO2 20 -  31 mmol/L _0 Calcium 8.6 - 10.2 mg/dL 8.9 9.6 9.2  Total Protein 6.1 - 8.1 g/dL 7.3 8.1 7.9  Total Bilirubin 0.2 - 1.2 mg/dL 0.4 0.4 0.4  Alkaline Phos 33 - 115 U/L 94 107 104  AST 10 - 30 U/L _1 ALT 6 - 29 U/L _2 Lipid Panel     Component Value Date/Time   CHOL 174 03/04/2016 0939   TRIG 96 03/04/2016 0939   HDL 58 03/04/2016 0939   CHOLHDL 3.0 03/04/2016 0939   VLDL 19 03/04/2016 0939   LDLCALC 97 03/04/2016  0939      Assessment & Plan:   1. Other specified hypothyroidism Controlled - levothyroxine (SYNTHROID, LEVOTHROID) 50 MCG tablet; Take 1 tablet (50 mcg total) by mouth daily before breakfast. Reported on 04/01/2015  Dispense: 30 tablet; Refill: 5 - TSH  2. Essential hypertension Controlled Low-sodium diet - labetalol (NORMODYNE) 200 MG tablet; Take 1 tablet (200 mg total) by mouth 2 (two) times daily.  Dispense: 60 tablet; Refill: 5 - CMP14+EGFR   Meds ordered this encounter  Medications  . levothyroxine (SYNTHROID, LEVOTHROID) 50 MCG tablet    Sig: Take 1 tablet (50 mcg total) by mouth daily before breakfast. Reported on 04/01/2015    Dispense:  30 tablet    Refill:  5  . labetalol (NORMODYNE) 200 MG tablet    Sig: Take 1 tablet (200 mg total) by mouth 2 (two) times daily.    Dispense:  60 tablet    Refill:  5    Follow-up: Return in about 6 months (around 02/11/2017) for follow up on Hypertension and Hypothyroidism.   Arnoldo Morale MD

## 2016-08-11 NOTE — Patient Instructions (Signed)
Hipotiroidismo (Hypothyroidism) El hipotiroidismo es un trastorno de la tiroides, una glndula grande ubicada en la parte anterior e inferior del cuello. La tiroides libera hormonas que controlan el funcionamiento del organismo. En los casos de hipotiroidismo, la glndula no produce la cantidad suficiente de estas hormonas. CAUSAS Las causas del hipotiroidismo pueden incluir lo siguiente:  Infecciones virales.  Embarazo.  Un ataque del sistema de defensa (sistema inmunitario) a la tiroides.  Algunos medicamentos.  Defectos de nacimiento.  Radioterapias anteriores en la cabeza o el cuello.  Tratamiento previo con yodo radioactivo.  Extirpacin quirrgica previa de una parte o de toda la tiroides.  Problemas con la glndula ubicada en el centro del cerebro (hipfisis). SIGNOS Y SNTOMAS Los signos y los sntomas de hipotiroidismo pueden ser los siguientes:  Sensacin de falta de energa (letargo).  Incapacidad para tolerar el fro.  Aumento de peso que no puede explicarse por un cambio en la dieta o en los hbitos de ejercicio fsico.  Piel seca.  Pelo grueso.  Irregularidades menstruales.  Ralentizacin de los procesos de pensamiento.  Estreimiento.  Tristeza o depresin. DIAGNSTICO El mdico puede diagnosticar el hipotiroidismo con anlisis de sangre y ecografas. TRATAMIENTO El hipotiroidismo se trata con medicamentos que reemplazan las hormonas que el cuerpo no produce. Despus de comenzar el tratamiento, pueden pasar varias semanas hasta la desaparicin de los sntomas. INSTRUCCIONES PARA EL CUIDADO EN EL HOGAR  Tome los medicamentos solamente como se lo haya indicado el mdico.  Si empieza a tomar medicamentos nuevos, infrmele al mdico.  Concurra a todas las visitas de control como se lo haya indicado el mdico. Esto es importante. A medida que la enfermedad mejora, es posible que haya que modificar las dosis. Tendr que hacerse anlisis de sangre  peridicamente, de modo que el mdico pueda controlar la enfermedad.  SOLICITE ATENCIN MDICA SI:  Los sntomas no mejoran con el tratamiento.  Est tomando medicamentos sustitutivos de la tiroides y: ? Suda en exceso. ? Siente temblores. ? Est ansioso. ? Baja de peso rpidamente. ? No puede tolerar el calor. ? Tiene cambios emocionales. ? Tiene diarrea. ? Se siente dbil.  SOLICITE ATENCIN MDICA DE INMEDIATO SI:  Siente dolor en el pecho.  Tiene latidos cardacos irregulares o siente dolor en el pecho.  Nota que la frecuencia cardaca est acelerada.  Esta informacin no tiene como fin reemplazar el consejo del mdico. Asegrese de hacerle al mdico cualquier pregunta que tenga. Document Released: 03/30/2005 Document Revised: 04/20/2014 Document Reviewed: 08/15/2013 Elsevier Interactive Patient Education  2017 Elsevier Inc.  

## 2016-08-12 ENCOUNTER — Telehealth: Payer: Self-pay

## 2016-08-12 LAB — CMP14+EGFR
ALBUMIN: 4.6 g/dL (ref 3.5–5.5)
ALK PHOS: 115 IU/L (ref 39–117)
ALT: 20 IU/L (ref 0–32)
AST: 17 IU/L (ref 0–40)
Albumin/Globulin Ratio: 1.6 (ref 1.2–2.2)
BUN / CREAT RATIO: 24 — AB (ref 9–23)
BUN: 15 mg/dL (ref 6–24)
Bilirubin Total: <0.2 mg/dL (ref 0.0–1.2)
CALCIUM: 9.1 mg/dL (ref 8.7–10.2)
CO2: 24 mmol/L (ref 18–29)
Chloride: 97 mmol/L (ref 96–106)
Creatinine, Ser: 0.62 mg/dL (ref 0.57–1.00)
GFR calc Af Amer: 130 mL/min/{1.73_m2} (ref >59)
GFR, EST NON AFRICAN AMERICAN: 113 mL/min/{1.73_m2} (ref >59)
GLUCOSE: 90 mg/dL (ref 65–99)
Globulin, Total: 2.9 g/dL (ref 1.5–4.5)
Potassium: 4.1 mmol/L (ref 3.5–5.2)
Sodium: 139 mmol/L (ref 134–144)
Total Protein: 7.5 g/dL (ref 6.0–8.5)

## 2016-08-12 LAB — TSH: TSH: 3.08 u[IU]/mL (ref 0.450–4.500)

## 2016-08-12 NOTE — Telephone Encounter (Signed)
Clld pt  -   Used [employer] ID# [id number]  Advsd of lab results. Pt stated she understood.

## 2016-08-12 NOTE — Telephone Encounter (Signed)
-----   Message from Jaclyn Shaggy, MD sent at 08/12/2016  4:43 PM EDT ----- Please inform the patient that labs are normal. Thank you.

## 2016-09-02 MED FILL — LEVOTHYROXINE 50 MCG TABLET: 50 | 30 days supply | Qty: 30 | Fill #0

## 2016-09-02 MED FILL — LABETALOL HCL 200 MG TABLET: 200 | 30 days supply | Qty: 60 | Fill #0

## 2016-09-08 ENCOUNTER — Ambulatory Visit: Payer: Self-pay | Attending: Family Medicine

## 2016-10-06 MED FILL — LEVOTHYROXINE 50 MCG TABLET: 50 | 30 days supply | Qty: 30 | Fill #1

## 2016-11-16 MED FILL — LEVOTHYROXINE 50 MCG TABLET: 50 | 30 days supply | Qty: 30 | Fill #2

## 2016-11-23 MED FILL — LABETALOL HCL 200 MG TABLET: 200 | 30 days supply | Qty: 60 | Fill #1

## 2016-12-23 MED FILL — LEVOTHYROXINE 50 MCG TABLET: 50 | 30 days supply | Qty: 30 | Fill #3

## 2017-01-29 MED FILL — LEVOTHYROXINE 50 MCG TABLET: 50 | 30 days supply | Qty: 30 | Fill #4

## 2017-01-29 MED FILL — LABETALOL HCL 200 MG TABLET: 200 | 30 days supply | Qty: 60 | Fill #2

## 2017-02-24 ENCOUNTER — Ambulatory Visit: Payer: Self-pay

## 2017-03-03 ENCOUNTER — Ambulatory Visit: Payer: Self-pay | Attending: Family Medicine

## 2017-03-16 MED FILL — LEVOTHYROXINE 50 MCG TABLET: 50 | 30 days supply | Qty: 30 | Fill #5

## 2017-03-17 ENCOUNTER — Ambulatory Visit: Payer: Self-pay | Attending: Family Medicine | Admitting: Family Medicine

## 2017-03-17 ENCOUNTER — Encounter: Payer: Self-pay | Admitting: Family Medicine

## 2017-03-17 VITALS — BP 157/96 | HR 92 | Temp 97.6°F | Ht <= 58 in | Wt 159.2 lb

## 2017-03-17 DIAGNOSIS — Z9889 Other specified postprocedural states: Secondary | ICD-10-CM | POA: Insufficient documentation

## 2017-03-17 DIAGNOSIS — Z9109 Other allergy status, other than to drugs and biological substances: Secondary | ICD-10-CM | POA: Insufficient documentation

## 2017-03-17 DIAGNOSIS — Z79899 Other long term (current) drug therapy: Secondary | ICD-10-CM | POA: Insufficient documentation

## 2017-03-17 DIAGNOSIS — E038 Other specified hypothyroidism: Secondary | ICD-10-CM | POA: Insufficient documentation

## 2017-03-17 DIAGNOSIS — Z23 Encounter for immunization: Secondary | ICD-10-CM | POA: Insufficient documentation

## 2017-03-17 DIAGNOSIS — I10 Essential (primary) hypertension: Secondary | ICD-10-CM | POA: Insufficient documentation

## 2017-03-17 MED ORDER — LABETALOL HCL 200 MG PO TABS: 200.0000 mg | ORAL_TABLET | Freq: Two times a day (BID) | ORAL | 6 refills | Status: DC

## 2017-03-17 NOTE — Progress Notes (Signed)
Subjective:  Patient ID: Sharon Brennan, female    DOB: 1975-06-30  Age: 41 y.o. MRN: [id number]  CC: Hypertension   HPI Sharon Brennan is a 41 year old female with a history of hypertension, hypothyroidism who presents to the clinic for a follow-up visit. Her blood pressure is elevated today and she endorses not taking her antihypertensives this morning as she is fasting in anticipation of labs and only takes her antihypertensive with food. She has been compliant with her levothyroxine and tolerates her medications and denies the presence of adverse effects.  She has not been able to exercise as much as she would like to but she does try on some days. She has no additional concerns today and is willing to receive the flu shot.  Past Medical History:  Diagnosis Date  . Hypertension     Past Surgical History:  Procedure Laterality Date  . CESAREAN SECTION    . CESAREAN SECTION      Allergies  Allergen Reactions  . Pollen Extract      Outpatient Medications Prior to Visit  Medication Sig Dispense Refill  . levothyroxine (SYNTHROID, LEVOTHROID) 50 MCG tablet Take 1 tablet (50 mcg total) by mouth daily before breakfast. Reported on 04/01/2015 30 tablet 5  . labetalol (NORMODYNE) 200 MG tablet Take 1 tablet (200 mg total) by mouth 2 (two) times daily. 60 tablet 5   No facility-administered medications prior to visit.     ROS Review of Systems  Constitutional: Negative for activity change, appetite change and fatigue.  HENT: Negative for congestion, sinus pressure and sore throat.   Eyes: Negative for visual disturbance.  Respiratory: Negative for cough, chest tightness, shortness of breath and wheezing.   Cardiovascular: Negative for chest pain and palpitations.  Gastrointestinal: Negative for abdominal distention, abdominal pain and constipation.  Endocrine: Negative for polydipsia.  Genitourinary: Negative for dysuria and frequency.  Musculoskeletal: Negative  for arthralgias and back pain.  Skin: Negative for rash.  Neurological: Negative for tremors, light-headedness and numbness.  Hematological: Does not bruise/bleed easily.  Psychiatric/Behavioral: Negative for agitation and behavioral problems.    Objective:  BP (!) 157/96   Pulse 92   Temp 97.6 F (36.4 C) (Oral)   Ht '4\' 10"'  (1.473 m)   Wt 159 lb 3.2 oz (72.2 kg)   LMP 03/09/2017   SpO2 100%   BMI 33.27 kg/m   BP/Weight 03/17/2017 08/11/2016 66/09/3014  Systolic BP 010 932 355  Diastolic BP 96 87 84  Wt. (Lbs) 159.2 165.4 160  BMI 33.27 34.57 32.32      Physical Exam  Constitutional: She is oriented to person, place, and time. She appears well-developed and well-nourished.  Cardiovascular: Normal rate, normal heart sounds and intact distal pulses.  No murmur heard. Pulmonary/Chest: Effort normal and breath sounds normal. She has no wheezes. She has no rales. She exhibits no tenderness.  Abdominal: Soft. Bowel sounds are normal. She exhibits no distension and no mass. There is no tenderness.  Musculoskeletal: Normal range of motion.  Neurological: She is alert and oriented to person, place, and time.  Skin: Skin is warm and dry.  Psychiatric: She has a normal mood and affect.     Assessment & Plan:   1. Essential hypertension Uncontrolled due to not taking morning dose of antihypertensive No regimen change today Low sodium, DASH diet and lifestyle modifications - CMP14+EGFR - Lipid panel - labetalol (NORMODYNE) 200 MG tablet; Take 1 tablet (200 mg total) by mouth 2 (two) times daily.  Dispense: 60 tablet; Refill: 6  2. Need for influenza vaccination - Flu Vaccine QUAD 36+ mos IM  3. Other specified hypothyroidism Controlled We will adjust levothyroxine dose after labs if needed - T4, free - TSH   Meds ordered this encounter  Medications  . labetalol (NORMODYNE) 200 MG tablet    Sig: Take 1 tablet (200 mg total) by mouth 2 (two) times daily.    Dispense:   60 tablet    Refill:  6    Follow-up: Return in about 6 months (around 09/15/2017) for follow up of Hypertension and Hypothyroidism.   Arnoldo Morale MD

## 2017-03-17 NOTE — Patient Instructions (Signed)

## 2017-03-18 ENCOUNTER — Other Ambulatory Visit: Payer: Self-pay | Admitting: Family Medicine

## 2017-03-18 ENCOUNTER — Telehealth: Payer: Self-pay

## 2017-03-18 DIAGNOSIS — E038 Other specified hypothyroidism: Secondary | ICD-10-CM

## 2017-03-18 LAB — CMP14+EGFR
ALBUMIN: 4.6 g/dL (ref 3.5–5.5)
ALT: 18 IU/L (ref 0–32)
AST: 16 IU/L (ref 0–40)
Albumin/Globulin Ratio: 1.6 (ref 1.2–2.2)
Alkaline Phosphatase: 106 IU/L (ref 39–117)
BUN / CREAT RATIO: 18 (ref 9–23)
BUN: 9 mg/dL (ref 6–24)
Bilirubin Total: 0.2 mg/dL (ref 0.0–1.2)
CALCIUM: 9.5 mg/dL (ref 8.7–10.2)
CHLORIDE: 102 mmol/L (ref 96–106)
CO2: 25 mmol/L (ref 20–29)
Creatinine, Ser: 0.51 mg/dL — ABNORMAL LOW (ref 0.57–1.00)
GFR calc non Af Amer: 120 mL/min/{1.73_m2} (ref >59)
GFR, EST AFRICAN AMERICAN: 138 mL/min/{1.73_m2} (ref >59)
GLUCOSE: 99 mg/dL (ref 65–99)
Globulin, Total: 2.8 g/dL (ref 1.5–4.5)
Potassium: 4.2 mmol/L (ref 3.5–5.2)
Sodium: 142 mmol/L (ref 134–144)
TOTAL PROTEIN: 7.4 g/dL (ref 6.0–8.5)

## 2017-03-18 LAB — LIPID PANEL
CHOL/HDL RATIO: 3.3 ratio (ref 0.0–4.4)
CHOLESTEROL TOTAL: 184 mg/dL (ref 100–199)
HDL: 56 mg/dL (ref >39)
LDL CALC: 96 mg/dL (ref 0–99)
TRIGLYCERIDES: 162 mg/dL — AB (ref 0–149)
VLDL Cholesterol Cal: 32 mg/dL (ref 5–40)

## 2017-03-18 LAB — T4, FREE: FREE T4: 1.29 ng/dL (ref 0.82–1.77)

## 2017-03-18 LAB — TSH: TSH: 3.3 u[IU]/mL (ref 0.450–4.500)

## 2017-03-18 MED ORDER — LEVOTHYROXINE SODIUM 50 MCG PO TABS: 50.0000 ug | ORAL_TABLET | Freq: Every day | ORAL | 5 refills | Status: DC

## 2017-03-18 NOTE — Telephone Encounter (Signed)
Pt was called and there is no vm set up to leave a message.

## 2017-03-31 ENCOUNTER — Telehealth: Payer: Self-pay | Admitting: Family Medicine

## 2017-03-31 NOTE — Telephone Encounter (Signed)
Pt. Called requesting lab results from 12/5. Pt. Was in formed of lab results being normal.

## 2017-04-29 MED FILL — LABETALOL HCL 200 MG TABS: 200 | 30 days supply | Qty: 60 | Fill #0

## 2017-04-29 MED FILL — LEVOTHYROXINE 50 MCG TABLET: 50 | 30 days supply | Qty: 30 | Fill #0

## 2017-06-08 MED FILL — LEVOTHYROXINE 50 MCG TABLET: 50 | 30 days supply | Qty: 30 | Fill #1

## 2017-07-20 MED FILL — LEVOTHYROXINE 50 MCG TABLET: 50 | 30 days supply | Qty: 30 | Fill #2

## 2017-08-30 MED FILL — LEVOTHYROXINE 50 MCG TABLET: 50 | 30 days supply | Qty: 30 | Fill #3

## 2017-09-03 ENCOUNTER — Encounter (HOSPITAL_COMMUNITY): Payer: Self-pay

## 2017-09-03 ENCOUNTER — Ambulatory Visit (HOSPITAL_COMMUNITY)
Admission: EM | Admit: 2017-09-03 | Discharge: 2017-09-03 | Disposition: A | Discharge status: Home / Self Care | Payer: Self-pay | Attending: Family Medicine | Admitting: Family Medicine

## 2017-09-03 ENCOUNTER — Ambulatory Visit: Payer: Self-pay | Attending: Family Medicine

## 2017-09-03 DIAGNOSIS — B370 Candidal stomatitis: Secondary | ICD-10-CM

## 2017-09-03 DIAGNOSIS — J029 Acute pharyngitis, unspecified: Secondary | ICD-10-CM

## 2017-09-03 MED ORDER — NYSTATIN 100000 UNIT/ML MT SUSP: 500000.0000 [IU] | Freq: Four times a day (QID) | OROMUCOSAL | 0 refills | Status: DC

## 2017-09-03 MED FILL — NYSTATIN 100,000 UNITS/ML S: 100000 | 2 days supply | Qty: 30 | Fill #0

## 2017-09-03 MED FILL — LABETALOL HCL 200 MG TABLET: 200 | 30 days supply | Qty: 60 | Fill #1

## 2017-09-03 NOTE — ED Provider Notes (Signed)
[facility]    CSN: [id number] Arrival date & time: 09/03/17  1136     History   Chief Complaint Chief Complaint  Patient presents with  . Sore Throat    HPI Sharon Brennan is a 42 y.o. female.   Patient complains of pain in her throat and neck.  She has been on thyroid supplement since 2013 and has it checked every 6 months.  She notes a white coating on her tongue.  Also admits to increased stress causing some pain in her trapezius muscles in the back of her head.  HPI  Past Medical History:  Diagnosis Date  . Hypertension     Patient Active Problem List   Diagnosis Date Noted  . Hypothyroidism 04/01/2015  . Hypertension 01/22/2011  . LIVER FUNCTION TESTS, ABNORMAL, HX OF 11/25/2007    Past Surgical History:  Procedure Laterality Date  . CESAREAN SECTION    . CESAREAN SECTION      OB History    Gravida  4   Para  3   Term  3   Preterm  0   AB  1   Living        SAB  1   TAB  0   Ectopic  0   Multiple      Live Births               Home Medications    Prior to Admission medications   Medication Sig Start Date End Date Taking? Authorizing Provider  labetalol (NORMODYNE) 200 MG tablet Take 1 tablet (200 mg total) by mouth 2 (two) times daily. 03/17/17  Yes Hoy Register, MD  levothyroxine (SYNTHROID, LEVOTHROID) 50 MCG tablet Take 1 tablet (50 mcg total) by mouth daily before breakfast. Reported on 04/01/2015 03/18/17  Yes Hoy Register, MD    Family History Family History  Problem Relation Age of Onset  . Hypertension Mother     Social History Social History   Tobacco Use  . Smoking status: Never Smoker  . Smokeless tobacco: Never Used  Substance Use Topics  . Alcohol use: No  . Drug use: No     Allergies   Pollen extract   Review of Systems Review of Systems  Constitutional: Negative for chills and fever.  HENT: Positive for sore throat. Negative for ear pain.   Eyes: Negative for pain and visual  disturbance.  Respiratory: Negative for cough and shortness of breath.   Cardiovascular: Negative for chest pain and palpitations.  Gastrointestinal: Negative for abdominal pain and vomiting.  Genitourinary: Negative for dysuria and hematuria.  Musculoskeletal: Negative for arthralgias and back pain.  Skin: Negative for color change and rash.  Neurological: Negative for seizures and syncope.  All other systems reviewed and are negative.    Physical Exam Triage Vital Signs ED Triage Vitals [09/03/17 1154]  Enc Vitals Group     BP (!) 141/81     Pulse Rate 79     Resp 18     Temp 97.8 F (36.6 C)     Temp src      SpO2 99 %     Weight      Height      Head Circumference      Peak Flow      Pain Score      Pain Loc      Pain Edu?      Excl. in GC?    No data found.  Updated Vital Signs  BP (!) 141/81   Pulse 79   Temp 97.8 F (36.6 C)   Resp 18   SpO2 99%   Visual Acuity Right Eye Distance:   Left Eye Distance:   Bilateral Distance:    Right Eye Near:   Left Eye Near:    Bilateral Near:     Physical Exam  Constitutional: She appears well-developed and well-nourished.  HENT:  Mouth/Throat: No oral lesions. No uvula swelling. No oropharyngeal exudate. Tonsils are 0 on the right. Tonsils are 0 on the left. No tonsillar exudate.     UC Treatments / Results  Labs (all labs ordered are listed, but only abnormal results are displayed) Labs Reviewed - No data to display  EKG None  Radiology No results found.  Procedures Procedures (including critical care time)  Medications Ordered in UC Medications - No data to display  Initial Impression / Assessment and Plan / UC Course  I have reviewed the triage vital signs and the nursing notes.  Pertinent labs & imaging results that were available during my care of the patient were reviewed by me and considered in my medical decision making (see chart for details).    Sore throat, likely secondary to thrush.   Cannot rule out thyroiditis with tenderness over the thyroid.  Have suggested anti-inflammatory and will provide prescription for nystatin mouthwash  Final Clinical Impressions(s) / UC Diagnoses   Final diagnoses:  None   Discharge Instructions   None    ED Prescriptions    None     Controlled Substance Prescriptions [place] Controlled Substance Registry consulted? No   Frederica Kuster, MD 09/03/17 1234

## 2017-09-03 NOTE — ED Triage Notes (Signed)
Pt presents with complaints of sore throat x 1 day

## 2017-09-10 ENCOUNTER — Ambulatory Visit: Payer: Self-pay | Attending: Family Medicine

## 2017-10-04 MED FILL — LEVOTHYROXINE 50 MCG TABLET: 50 | 30 days supply | Qty: 30 | Fill #4

## 2017-10-07 ENCOUNTER — Other Ambulatory Visit: Payer: Self-pay

## 2017-10-07 ENCOUNTER — Emergency Department (HOSPITAL_COMMUNITY)
Admission: EM | Admit: 2017-10-07 | Discharge: 2017-10-07 | Disposition: A | Discharge status: Home / Self Care | Payer: Self-pay | Attending: Emergency Medicine | Admitting: Emergency Medicine

## 2017-10-07 ENCOUNTER — Encounter (HOSPITAL_COMMUNITY): Payer: Self-pay | Admitting: Emergency Medicine

## 2017-10-07 DIAGNOSIS — Z79899 Other long term (current) drug therapy: Secondary | ICD-10-CM | POA: Insufficient documentation

## 2017-10-07 DIAGNOSIS — I10 Essential (primary) hypertension: Secondary | ICD-10-CM | POA: Insufficient documentation

## 2017-10-07 DIAGNOSIS — E039 Hypothyroidism, unspecified: Secondary | ICD-10-CM | POA: Insufficient documentation

## 2017-10-07 DIAGNOSIS — K2100 Gastro-esophageal reflux disease with esophagitis, without bleeding: Secondary | ICD-10-CM

## 2017-10-07 DIAGNOSIS — B370 Candidal stomatitis: Secondary | ICD-10-CM

## 2017-10-07 DIAGNOSIS — B379 Candidiasis, unspecified: Secondary | ICD-10-CM | POA: Insufficient documentation

## 2017-10-07 LAB — GROUP A STREP BY PCR: GROUP A STREP BY PCR: NOT DETECTED

## 2017-10-07 MED ORDER — OMEPRAZOLE 20 MG PO CPDR: 20.0000 mg | DELAYED_RELEASE_CAPSULE | Freq: Every day | ORAL | 0 refills | Status: DC

## 2017-10-07 MED ORDER — NYSTATIN 100000 UNIT/ML MT SUSP: 500000.0000 [IU] | Freq: Four times a day (QID) | OROMUCOSAL | 0 refills | Status: DC

## 2017-10-07 MED FILL — OMEPRAZOLE 20 MG CAP: 20 | 30 days supply | Qty: 30 | Fill #0

## 2017-10-07 MED FILL — NYSTATIN 100,000 UNITS/ML S: 100000 | 2 days supply | Qty: 30 | Fill #0

## 2017-10-07 NOTE — ED Provider Notes (Signed)
Patient placed in Quick Look pathway, seen and evaluated   Chief Complaint: sore throat  HPI:   Sharon Brennan is a 42 y.o. female with hx of thyroid problems who presents to the ED with sore throat. The sore throat started yesterday. Patient was evaluated at Urgent Care one month ago for thrush on her tongue but continues to have the white coating. Patient states she feels like something stuck in her throat.   ROS: ENT: sore throat, coated tongue, ear pain yesterday  Physical Exam:  BP (!) 122/91 (BP Location: Right Arm)   Pulse 81   Temp 98 F (36.7 C) (Oral)   Resp 16   SpO2 99%    Gen: No distress  Neuro: Awake and Alert  Skin: Warm and dry  ENT: tongue coated white, throat with mild erythema.      Initiation of care has begun. The patient has been counseled on the process, plan, and necessity for staying for the completion/evaluation, and the remainder of the medical screening examination    Janne Napoleoneese, Mi Balla M, NP 10/07/17 1342    Arby BarrettePfeiffer, Marcy, MD 10/15/17 [phone]

## 2017-10-07 NOTE — Discharge Instructions (Addendum)
Please read attached information. If you experience any new or worsening signs or symptoms please return to the emergency room for evaluation. Please follow-up with your primary care provider or specialist as discussed. Please use medication prescribed only as directed and discontinue taking if you have any concerning signs or symptoms.   °

## 2017-10-07 NOTE — ED Triage Notes (Signed)
Patient complains of pain with swallowing since yesterday. States one month ago she was diagnosed with thrush and prescribed an antibiotic that resolved the issue, but the symptom returned last night. Denies shortness of breath, patient alert, oriented, and ambulating independently with steady gait.

## 2017-10-07 NOTE — ED Notes (Signed)
Patient able to ambulate independently  

## 2017-10-07 NOTE — ED Provider Notes (Signed)
[facility]   CSN: [id number] Arrival date & time: 10/07/17  1327   History   Chief Complaint No chief complaint on file.   HPI Sharon Brennan is a 42 y.o. female.  HPI    42 year old female presents today with complaints of throat pain.  Patient notes burning in her throat starting yesterday.  Patient does note a similar history of the same in the past which she was seen in urgent care and diagnosed with thrush.  She notes symptoms went away after using nystatin but came back after approximately 1 to 2 weeks.  Patient notes the pain is a burning sensation up in her throat.  She notes symptoms are worse after eating.,  Denies any difficulty swallowing or breathing.  No fevers.  No chronic health conditions other than hypothyroidism and hypertension    Past Medical History:  Diagnosis Date  . Hypertension     Patient Active Problem List   Diagnosis Date Noted  . Hypothyroidism 04/01/2015  . Hypertension 01/22/2011  . LIVER FUNCTION TESTS, ABNORMAL, HX OF 11/25/2007    Past Surgical History:  Procedure Laterality Date  . CESAREAN SECTION    . CESAREAN SECTION       OB History    Gravida  4   Para  3   Term  3   Preterm  0   AB  1   Living        SAB  1   TAB  0   Ectopic  0   Multiple      Live Births               Home Medications    Prior to Admission medications   Medication Sig Start Date End Date Taking? Authorizing Provider  labetalol (NORMODYNE) 200 MG tablet Take 1 tablet (200 mg total) by mouth 2 (two) times daily. 03/17/17   Hoy RegisterNewlin, Enobong, MD  levothyroxine (SYNTHROID, LEVOTHROID) 50 MCG tablet Take 1 tablet (50 mcg total) by mouth daily before breakfast. Reported on 04/01/2015 03/18/17   Hoy RegisterNewlin, Enobong, MD  nystatin (MYCOSTATIN) 100000 UNIT/ML suspension Take 5 mLs (500,000 Units total) by mouth 4 (four) times daily. 10/07/17   Talmadge Ganas, Tinnie GensJeffrey, PA-C  omeprazole (PRILOSEC) 20  MG capsule Take 1 capsule (20 mg total) by mouth daily. 10/07/17   Eyvonne MechanicHedges, Jamarrion Budai, PA-C    Family History Family History  Problem Relation Age of Onset  . Hypertension Mother     Social History Social History   Tobacco Use  . Smoking status: Never Smoker  . Smokeless tobacco: Never Used  Substance Use Topics  . Alcohol use: No  . Drug use: No     Allergies   Pollen extract   Review of Systems Review of Systems  All other systems reviewed and are negative.    Physical Exam Updated Vital Signs BP 138/84 (BP Location: Right Arm)   Pulse 68   Temp 98 F (36.7 C) (Oral)   Resp 16   SpO2 100%   Physical Exam  Constitutional: She is oriented to person, place, and time. She appears well-developed and well-nourished.  HENT:  Head: Normocephalic and atraumatic.  There is small amount of thin white coating noted to the posterior tongue, very faint posterior oropharynx erythema, no edema, no pooling of secretions, no signs of RPA PTA or other deep space infections.  Eyes: Pupils are equal, round, and reactive to light. Conjunctivae are normal. Right eye exhibits no discharge.  Left eye exhibits no discharge. No scleral icterus.  Neck: Normal range of motion. No JVD present. No tracheal deviation present.  Pulmonary/Chest: Effort normal. No stridor.  Neurological: She is alert and oriented to person, place, and time. Coordination normal.  Psychiatric: She has a normal mood and affect. Her behavior is normal. Judgment and thought content normal.  Nursing note and vitals reviewed.    ED Treatments / Results  Labs (all labs ordered are listed, but only abnormal results are displayed) Labs Reviewed  GROUP A STREP BY PCR    EKG None  Radiology No results found.  Procedures Procedures (including critical care time)  Medications Ordered in ED Medications - No data to display   Initial Impression / Assessment and Plan / ED Course  I have reviewed the triage vital  signs and the nursing notes.  Pertinent labs & imaging results that were available during my care of the patient were reviewed by me and considered in my medical decision making (see chart for details).     42 year old female presents today with sore throat.  I have high suspicion for acid reflux, she does have a very thin white coating to her tongue could be consistent with thrush.  She has been treated for thrush with improvement in her symptoms I do find it reasonable to treat her again along with Prilosec.  Patient was discharged with outpatient follow-up, strict return precautions.  She has no signs of chronic health problems including diabetes.  Patient will follow-up as an outpatient.  Final Clinical Impressions(s) / ED Diagnoses   Final diagnoses:  Thrush  Gastroesophageal reflux disease with esophagitis    ED Discharge Orders        Ordered    nystatin (MYCOSTATIN) 100000 UNIT/ML suspension  4 times daily     10/07/17 1526    omeprazole (PRILOSEC) 20 MG capsule  Daily     10/07/17 1541       Eyvonne Mechanic, PA-C 10/07/17 1542    Mancel Bale, MD 10/08/17 1635

## 2017-10-28 ENCOUNTER — Ambulatory Visit: Payer: Self-pay | Attending: Family Medicine | Admitting: Family Medicine

## 2017-10-28 ENCOUNTER — Encounter: Payer: Self-pay | Admitting: Family Medicine

## 2017-10-28 VITALS — BP 148/84 | HR 72 | Temp 97.7°F | Ht <= 58 in | Wt 142.0 lb

## 2017-10-28 DIAGNOSIS — E038 Other specified hypothyroidism: Secondary | ICD-10-CM

## 2017-10-28 DIAGNOSIS — I10 Essential (primary) hypertension: Secondary | ICD-10-CM

## 2017-10-28 DIAGNOSIS — K2100 Gastro-esophageal reflux disease with esophagitis, without bleeding: Secondary | ICD-10-CM

## 2017-10-28 DIAGNOSIS — Z79899 Other long term (current) drug therapy: Secondary | ICD-10-CM | POA: Insufficient documentation

## 2017-10-28 DIAGNOSIS — K219 Gastro-esophageal reflux disease without esophagitis: Secondary | ICD-10-CM | POA: Insufficient documentation

## 2017-10-28 MED ORDER — OMEPRAZOLE 20 MG PO CPDR: 20.0000 mg | DELAYED_RELEASE_CAPSULE | Freq: Every day | ORAL | 2 refills | Status: DC

## 2017-10-28 MED ORDER — LISINOPRIL-HYDROCHLOROTHIAZIDE 20-25 MG PO TABS: 1.0000 | ORAL_TABLET | Freq: Every day | ORAL | 3 refills | Status: DC

## 2017-10-28 MED FILL — LISINOPRIL-HCTZ 20-25 MG TA: 20-25 | 30 days supply | Qty: 30 | Fill #0

## 2017-10-28 NOTE — Progress Notes (Signed)
Subjective:  Patient ID: Sharon Brennan, female    DOB: 10-13-1975  Age: 42 y.o. MRN: [id number]  CC: Thyroid Problem   HPI Sharon Brennan is a 42 year old female with a history of hypertension, hypothyroidism who presents to the clinic for a follow-up visit.  She had an ED visit on 10/07/2017 where she was treated for acid reflux and commenced on omeprazole and reports improvement in her symptoms. She has cut out foods that trigger gout including salsa, tomatoes and acidic juices and has been walking for about 30 minutes every day and reports losing weight (of note she has lost 17 pounds in the last 7 months). She is wondering if she can be switched from labetalol back to lisinopril/hydrochlorothiazide which she took previously as the change was made due to her interest in conceiving however at this time she is no longer interested in trying to conceive. She is doing well on her levothyroxine and denies chest pains, abdominal pains, shortness of breath, diarrhea.  Past Medical History:  Diagnosis Date  . Hypertension     Past Surgical History:  Procedure Laterality Date  . CESAREAN SECTION    . CESAREAN SECTION      Allergies  Allergen Reactions  . Pollen Extract      Outpatient Medications Prior to Visit  Medication Sig Dispense Refill  . levothyroxine (SYNTHROID, LEVOTHROID) 50 MCG tablet Take 1 tablet (50 mcg total) by mouth daily before breakfast. Reported on 04/01/2015 30 tablet 5  . labetalol (NORMODYNE) 200 MG tablet Take 1 tablet (200 mg total) by mouth 2 (two) times daily. 60 tablet 6  . nystatin (MYCOSTATIN) 100000 UNIT/ML suspension Take 5 mLs (500,000 Units total) by mouth 4 (four) times daily. (Patient not taking: Reported on 10/28/2017) 30 mL 0  . omeprazole (PRILOSEC) 20 MG capsule Take 1 capsule (20 mg total) by mouth daily. (Patient not taking: Reported on 10/28/2017) 30 capsule 0   No facility-administered medications prior to visit.     ROS Review  of Systems  Constitutional: Negative for activity change, appetite change and fatigue.  HENT: Negative for congestion, sinus pressure and sore throat.   Eyes: Negative for visual disturbance.  Respiratory: Negative for cough, chest tightness, shortness of breath and wheezing.   Cardiovascular: Negative for chest pain and palpitations.  Gastrointestinal: Negative for abdominal distention, abdominal pain and constipation.  Endocrine: Negative for polydipsia.  Genitourinary: Negative for dysuria and frequency.  Musculoskeletal: Negative for arthralgias and back pain.  Skin: Negative for rash.  Neurological: Negative for tremors, light-headedness and numbness.  Hematological: Does not bruise/bleed easily.  Psychiatric/Behavioral: Negative for agitation and behavioral problems.    Objective:  BP (!) 148/84   Pulse 72   Temp 97.7 F (36.5 C) (Oral)   Ht '4\' 10"'  (1.473 m)   Wt 142 lb (64.4 kg)   SpO2 100%   BMI 29.68 kg/m   BP/Weight 10/28/2017 10/07/2017 5/39/7673  Systolic BP 419 379 024  Diastolic BP 84 84 81  Wt. (Lbs) 142 - -  BMI 29.68 - -      Physical Exam  Constitutional: She is oriented to person, place, and time. She appears well-developed and well-nourished.  Cardiovascular: Normal rate, normal heart sounds and intact distal pulses.  No murmur heard. Pulmonary/Chest: Effort normal and breath sounds normal. She has no wheezes. She has no rales. She exhibits no tenderness.  Abdominal: Soft. Bowel sounds are normal. She exhibits no distension and no mass. There is no tenderness.  Musculoskeletal: Normal  range of motion.  Neurological: She is alert and oriented to person, place, and time.  Skin: Skin is warm and dry.  Psychiatric: She has a normal mood and affect.     Assessment & Plan:   1. Essential hypertension She would like to switch from labetalol back to lisinopril/HCTZ which she took previously Counseled on blood pressure goal of less than 130/80,  low-sodium, DASH diet, medication compliance, 150 minutes of moderate intensity exercise per week. Discussed medication compliance, adverse effects. - lisinopril-hydrochlorothiazide (PRINZIDE,ZESTORETIC) 20-25 MG tablet; Take 1 tablet by mouth daily.  Dispense: 30 tablet; Refill: 3  2. Other specified hypothyroidism Controlled We will send of thyroid panel and refill levothyroxine accordingly - T4, free - TSH - CMP14+EGFR  3. Gastroesophageal reflux disease with esophagitis Improved since initiation of omeprazole Avoid late meals and foods that trigger GERD - omeprazole (PRILOSEC) 20 MG capsule; Take 1 capsule (20 mg total) by mouth daily.  Dispense: 30 capsule; Refill: 2   Meds ordered this encounter  Medications  . omeprazole (PRILOSEC) 20 MG capsule    Sig: Take 1 capsule (20 mg total) by mouth daily.    Dispense:  30 capsule    Refill:  2  . lisinopril-hydrochlorothiazide (PRINZIDE,ZESTORETIC) 20-25 MG tablet    Sig: Take 1 tablet by mouth daily.    Dispense:  30 tablet    Refill:  3    Follow-up: Return in about 3 months (around 01/28/2018) for follow up of chronic medical conditions.   [name] MD

## 2017-10-28 NOTE — Patient Instructions (Signed)
Enfermedad por reflujo gastroesofgico en los adultos (Gastroesophageal Reflux Disease, Adult) Normalmente, los alimentos descienden por el esfago y se depositan en el estmago para su digestin. Si una persona tiene enfermedad por reflujo gastroesofgico (ERGE), los alimentos y el cido estomacal regresan al esfago. Cuando esto ocurre, el esfago se irrita y se hincha (inflama). Con el tiempo, la ERGE puede provocar la formacin de pequeas perforaciones (lceras) en la mucosa del esfago. CUIDADOS EN EL HOGAR Dieta  Siga la dieta como se lo haya indicado el mdico. Tal vez deba evitar los siguientes alimentos y bebidas: ? Caf y t (con o sin cafena). ? Bebidas que contengan alcohol. ? Bebidas energizantes y deportivas. ? Gaseosas o refrescos. ? Chocolate y cacao. ? Menta y esencias de menta. ? Ajo y cebollas. ? Rbano picante. ? Alimentos muy condimentados y cidos, como pimientos, chile en polvo, curry en polvo, vinagre, salsas picantes y salsa barbacoa. ? Frutas ctricas y sus jugos, como naranjas, limones y limas. ? Alimentos a base de tomates, como salsa roja, chile, salsa y pizza con salsa roja. ? Alimentos fritos y grasos, como rosquillas, papas fritas y aderezos con alto contenido de grasa. ? Carnes con alto contenido de grasa, como hot dogs, filetes de entrecot, salchicha, jamn y tocino. ? Productos lcteos con alto contenido de grasa, como leche entera, mantequilla y queso crema.  Consuma pequeas porciones de comida con ms frecuencia. Evite consumir porciones abundantes.  Evite beber mucho lquido con las comidas.  No coma durante las 2 o 3horas previas a la hora de acostarse.  No se acueste inmediatamente despus de comer.  No haga actividad fsica enseguida despus de comer. Instrucciones generales  Est atento a cualquier cambio en los sntomas.  Tome los medicamentos de venta libre y los recetados solamente como se lo haya indicado el mdico. No tome  aspirina, ibuprofeno ni otros antiinflamatorios no esteroides (AINE), a menos que el mdico lo autorice.  No consuma ningn producto que contenga tabaco, lo que incluye cigarrillos, tabaco de mascar y cigarrillos electrnicos. Si necesita ayuda para dejar de fumar, consulte al mdico.  Use ropa suelta. No use nada ajustado alrededor de la cintura.  Levante (eleve) unas 6pulgadas (15centmetros) la cabecera de la cama.  Intente bajar el nivel de estrs. Si necesita ayuda para hacerlo, consulte al mdico.  Si tiene sobrepeso, adelgace hasta alcanzar un peso saludable. Pregntele a su mdico cmo puede perder peso de manera segura.  Concurra a todas las visitas de control como se lo haya indicado el mdico. Esto es importante. SOLICITE AYUDA SI:  Aparecen nuevos sntomas.  Baja de peso y no sabe por qu.  Tiene dificultad para tragar o siente dolor al hacerlo.  Tiene sibilancias o tos que no desaparece.  Los sntomas no mejoran con el tratamiento.  Tiene la voz ronca. SOLICITE AYUDA DE INMEDIATO SI:  Tiene dolor en los brazos, el cuello, los maxilares, la dentadura o la espalda.  Transpira, se marea o tiene sensacin de desvanecimiento.  Siente falta de aire o dolor en el pecho.  Vomita y el vmito es parecido a la sangre o a los granos de caf.  Pierde el conocimiento (se desmaya).  Las heces son sanguinolentas o de color negro.  No puede tragar, beber o comer. Esta informacin no tiene como fin reemplazar el consejo del mdico. Asegrese de hacerle al mdico cualquier pregunta que tenga. Document Released: 05/02/2010 Document Revised: 12/19/2014 Document Reviewed: 07/25/2014 Elsevier Interactive Patient Education  2018 [employer]  

## 2017-10-29 ENCOUNTER — Other Ambulatory Visit: Payer: Self-pay | Admitting: Family Medicine

## 2017-10-29 DIAGNOSIS — E038 Other specified hypothyroidism: Secondary | ICD-10-CM

## 2017-10-29 LAB — T4, FREE: Free T4: 1.42 ng/dL (ref 0.82–1.77)

## 2017-10-29 LAB — TSH: TSH: 3.15 u[IU]/mL (ref 0.450–4.500)

## 2017-10-29 MED ORDER — LEVOTHYROXINE SODIUM 50 MCG PO TABS: 50.0000 ug | ORAL_TABLET | Freq: Every day | ORAL | 5 refills | Status: DC

## 2017-10-29 MED FILL — LEVOTHYROXINE 50 MCG TABLET: 50 | 30 days supply | Qty: 30 | Fill #0

## 2017-10-30 LAB — CMP14+EGFR
A/G RATIO: 1.6 (ref 1.2–2.2)
ALBUMIN: 4.5 g/dL (ref 3.5–5.5)
ALK PHOS: 87 IU/L (ref 39–117)
ALT: 17 IU/L (ref 0–32)
AST: 16 IU/L (ref 0–40)
BILIRUBIN TOTAL: 0.2 mg/dL (ref 0.0–1.2)
BUN/Creatinine Ratio: 14 (ref 9–23)
BUN: 8 mg/dL (ref 6–24)
CHLORIDE: 99 mmol/L (ref 96–106)
CO2: 21 mmol/L (ref 20–29)
Calcium: 9.4 mg/dL (ref 8.7–10.2)
Creatinine, Ser: 0.57 mg/dL (ref 0.57–1.00)
GFR, EST AFRICAN AMERICAN: 133 mL/min/{1.73_m2} (ref >59)
GFR, EST NON AFRICAN AMERICAN: 116 mL/min/{1.73_m2} (ref >59)
Globulin, Total: 2.8 g/dL (ref 1.5–4.5)
Glucose: 103 mg/dL — ABNORMAL HIGH (ref 65–99)
POTASSIUM: 3.9 mmol/L (ref 3.5–5.2)
Sodium: 138 mmol/L (ref 134–144)
Total Protein: 7.3 g/dL (ref 6.0–8.5)

## 2017-10-30 LAB — SPECIMEN STATUS REPORT

## 2017-11-01 MED FILL — ?OMEPRazole 20mg CPDR: 20 | 30 days supply | Qty: 30 | Fill #0

## 2017-11-02 ENCOUNTER — Telehealth: Payer: Self-pay | Admitting: Family Medicine

## 2017-11-02 NOTE — Telephone Encounter (Signed)
Patient called requesting lab results and patient was informed of results being normal. Patient had no questions.

## 2017-11-05 ENCOUNTER — Telehealth: Payer: Self-pay

## 2017-11-05 NOTE — Telephone Encounter (Signed)
Patient was called and informed of lab results via interpretor([id number]). 

## 2017-12-03 MED FILL — ?OMEPRazole 20mg CPDR: 20 | 30 days supply | Qty: 30 | Fill #1

## 2017-12-03 MED FILL — LEVOTHYROXINE 50 MCG TABLET: 50 | 30 days supply | Qty: 30 | Fill #1

## 2018-01-03 MED FILL — LISINOPRIL-HCTZ 20-25 MG TA: 20-25 | 30 days supply | Qty: 30 | Fill #1

## 2018-01-03 MED FILL — LEVOTHYROXINE 50 MCG TABLET: 50 | 30 days supply | Qty: 30 | Fill #2

## 2018-01-19 ENCOUNTER — Ambulatory Visit: Payer: Self-pay | Attending: Family Medicine | Admitting: Family Medicine

## 2018-01-19 ENCOUNTER — Encounter: Payer: Self-pay | Admitting: Family Medicine

## 2018-01-19 VITALS — BP 120/82 | HR 88 | Temp 98.1°F | Ht <= 58 in | Wt 135.0 lb

## 2018-01-19 DIAGNOSIS — K2100 Gastro-esophageal reflux disease with esophagitis, without bleeding: Secondary | ICD-10-CM

## 2018-01-19 DIAGNOSIS — E038 Other specified hypothyroidism: Secondary | ICD-10-CM | POA: Insufficient documentation

## 2018-01-19 DIAGNOSIS — Z79899 Other long term (current) drug therapy: Secondary | ICD-10-CM | POA: Insufficient documentation

## 2018-01-19 DIAGNOSIS — J029 Acute pharyngitis, unspecified: Secondary | ICD-10-CM | POA: Insufficient documentation

## 2018-01-19 DIAGNOSIS — R0982 Postnasal drip: Secondary | ICD-10-CM | POA: Insufficient documentation

## 2018-01-19 DIAGNOSIS — I10 Essential (primary) hypertension: Secondary | ICD-10-CM | POA: Insufficient documentation

## 2018-01-19 DIAGNOSIS — Z79890 Hormone replacement therapy (postmenopausal): Secondary | ICD-10-CM | POA: Insufficient documentation

## 2018-01-19 MED ORDER — CETIRIZINE HCL 10 MG PO TABS: 10.0000 mg | ORAL_TABLET | Freq: Every day | ORAL | 1 refills | Status: DC

## 2018-01-19 MED ORDER — LISINOPRIL-HYDROCHLOROTHIAZIDE 20-25 MG PO TABS: 1.0000 | ORAL_TABLET | Freq: Every day | ORAL | 3 refills | Status: DC

## 2018-01-19 MED ORDER — OMEPRAZOLE 20 MG PO CPDR: 20.0000 mg | DELAYED_RELEASE_CAPSULE | Freq: Every day | ORAL | 2 refills | Status: DC

## 2018-01-19 MED ORDER — LEVOTHYROXINE SODIUM 50 MCG PO TABS: 50.0000 ug | ORAL_TABLET | Freq: Every day | ORAL | 3 refills | Status: DC

## 2018-01-19 MED FILL — ?CETIRIZINE HCL 10 MG TABLE: 10 | 30 days supply | Qty: 30 | Fill #0

## 2018-01-19 NOTE — Patient Instructions (Signed)

## 2018-01-19 NOTE — Progress Notes (Signed)
Subjective:  Patient ID: Sharon Brennan, female    DOB: 17-Jul-1975  Age: 42 y.o. MRN: [id number]  CC: Hypothyroidism and Hypertension   HPI Sharon Brennan  is a 42 year old female with a history of hypertension, hypothyroidism, GERD who presents today to the clinic with a 2-day history of sore throat described as a burning sensation in his throat and strange feeling in her tongue and having to clear her throat a lot.  Her throat also feels scratchy.  She denies the presence of cough, shortness of breath, sinus congestion. It feels like she has phlegm in her throat was something stuck in her throat. She is on omeprazole for reflux symptoms and in the past has had epidural up to taste in her mouth but denies symptoms at this time and has no abdominal pain. She is doing well on her antihypertensive and is tolerating her levothyroxine which she takes for hypothyroidism.  Past Medical History:  Diagnosis Date  . Hypertension     Past Surgical History:  Procedure Laterality Date  . CESAREAN SECTION    . CESAREAN SECTION      Allergies  Allergen Reactions  . Pollen Extract      Outpatient Medications Prior to Visit  Medication Sig Dispense Refill  . levothyroxine (SYNTHROID, LEVOTHROID) 50 MCG tablet Take 1 tablet (50 mcg total) by mouth daily before breakfast. Reported on 04/01/2015 30 tablet 5  . lisinopril-hydrochlorothiazide (PRINZIDE,ZESTORETIC) 20-25 MG tablet Take 1 tablet by mouth daily. 30 tablet 3  . omeprazole (PRILOSEC) 20 MG capsule Take 1 capsule (20 mg total) by mouth daily. 30 capsule 2  . nystatin (MYCOSTATIN) 100000 UNIT/ML suspension Take 5 mLs (500,000 Units total) by mouth 4 (four) times daily. (Patient not taking: Reported on 10/28/2017) 30 mL 0   No facility-administered medications prior to visit.     ROS Review of Systems  Constitutional: Negative for activity change, appetite change and fatigue.  HENT: Positive for sore throat. Negative for  congestion and sinus pressure.   Eyes: Negative for visual disturbance.  Respiratory: Negative for cough, chest tightness, shortness of breath and wheezing.   Cardiovascular: Negative for chest pain and palpitations.  Gastrointestinal: Negative for abdominal distention, abdominal pain and constipation.  Endocrine: Negative for polydipsia.  Genitourinary: Negative for dysuria and frequency.  Musculoskeletal: Negative for arthralgias and back pain.  Skin: Negative for rash.  Neurological: Negative for tremors, light-headedness and numbness.  Hematological: Does not bruise/bleed easily.  Psychiatric/Behavioral: Negative for agitation and behavioral problems.    Objective:  BP 120/82   Pulse 88   Temp 98.1 F (36.7 C) (Oral)   Ht 4\' 10"  (1.473 m)   Wt 135 lb (61.2 kg)   SpO2 100%   BMI 28.22 kg/m   BP/Weight 01/19/2018 10/28/2017 10/07/2017  Systolic BP 120 148 138  Diastolic BP 82 84 84  Wt. (Lbs) 135 142 -  BMI 28.22 29.68 -      Physical Exam  Constitutional: She is oriented to person, place, and time. She appears well-developed and well-nourished.  HENT:  Right Ear: External ear normal.  Left Ear: External ear normal.  Mouth/Throat: Oropharynx is clear and moist.  Cardiovascular: Normal rate, normal heart sounds and intact distal pulses.  No murmur heard. Pulmonary/Chest: Effort normal and breath sounds normal. She has no wheezes. She has no rales. She exhibits no tenderness.  Abdominal: Soft. Bowel sounds are normal. She exhibits no distension and no mass. There is no tenderness.  Musculoskeletal: Normal range of motion.  Neurological: She is alert and oriented to person, place, and time.    CMP Latest Ref Rng & Units 10/28/2017 03/17/2017 08/11/2016  Glucose 65 - 99 mg/dL 161(W) 99 90  BUN 6 - 24 mg/dL 8 9 15   Creatinine 0.57 - 1.00 mg/dL 9.60 4.54(U) 9.81  Sodium 134 - 144 mmol/L 138 142 139  Potassium 3.5 - 5.2 mmol/L 3.9 4.2 4.1  Chloride 96 - 106 mmol/L 99 102 97    CO2 20 - 29 mmol/L 21 25 24   Calcium 8.7 - 10.2 mg/dL 9.4 9.5 9.1  Total Protein 6.0 - 8.5 g/dL 7.3 7.4 7.5  Total Bilirubin 0.0 - 1.2 mg/dL 0.2 0.2 <1.9  Alkaline Phos 39 - 117 IU/L 87 106 115  AST 0 - 40 IU/L 16 16 17   ALT 0 - 32 IU/L 17 18 20     Lab Results  Component Value Date   TSH 3.150 10/28/2017    Assessment & Plan:   1. Sore throat With associated postnasal drip - cetirizine (ZYRTEC) 10 MG tablet; Take 1 tablet (10 mg total) by mouth daily.  Dispense: 30 tablet; Refill: 1  2. Essential hypertension Controlled Counseled on blood pressure goal of less than 130/80, low-sodium, DASH diet, medication compliance, 150 minutes of moderate intensity exercise per week. Discussed medication compliance, adverse effects. - lisinopril-hydrochlorothiazide (PRINZIDE,ZESTORETIC) 20-25 MG tablet; Take 1 tablet by mouth daily.  Dispense: 30 tablet; Refill: 3  3. Gastroesophageal reflux disease with esophagitis Stable - omeprazole (PRILOSEC) 20 MG capsule; Take 1 capsule (20 mg total) by mouth daily.  Dispense: 30 capsule; Refill: 2  4. Other specified hypothyroidism Controlled - levothyroxine (SYNTHROID, LEVOTHROID) 50 MCG tablet; Take 1 tablet (50 mcg total) by mouth daily before breakfast. Reported on 04/01/2015  Dispense: 30 tablet; Refill: 3   Meds ordered this encounter  Medications  . cetirizine (ZYRTEC) 10 MG tablet    Sig: Take 1 tablet (10 mg total) by mouth daily.    Dispense:  30 tablet    Refill:  1  . lisinopril-hydrochlorothiazide (PRINZIDE,ZESTORETIC) 20-25 MG tablet    Sig: Take 1 tablet by mouth daily.    Dispense:  30 tablet    Refill:  3  . omeprazole (PRILOSEC) 20 MG capsule    Sig: Take 1 capsule (20 mg total) by mouth daily.    Dispense:  30 capsule    Refill:  2  . levothyroxine (SYNTHROID, LEVOTHROID) 50 MCG tablet    Sig: Take 1 tablet (50 mcg total) by mouth daily before breakfast. Reported on 04/01/2015    Dispense:  30 tablet    Refill:  3     Follow-up: Return in about 3 months (around 04/21/2018) for follow up of chronic medical conditions.   Hoy Register MD

## 2018-01-19 NOTE — Progress Notes (Signed)
Patient has been having a sore throat.

## 2018-02-07 MED FILL — LEVOTHYROXINE 50 MCG TABLET: 50 | 30 days supply | Qty: 30 | Fill #3

## 2018-03-08 MED FILL — LEVOTHYROXINE 50 MCG TABLET: 50 | 30 days supply | Qty: 30 | Fill #4

## 2018-03-08 MED FILL — LISINOPRIL-HCTZ 20-25 MG TA: 20-25 | 30 days supply | Qty: 30 | Fill #2

## 2018-04-13 NOTE — L&D Delivery Note (Signed)
Delivery Note Sharon Brennan is a 43 y.o. [id number] at [redacted]w[redacted]d admitted for IOL for cHTN and BPP 6/8.  Labor course: Pt foley bulb/Pitocin induction. Pt AROM @0500 , clear fluid. Pt rapidly progressed to C/C/+3.  ROM: 2h 14m with clear fluid  At 0734 a viable girl was delivered via vaginal birth after cesarean (VBAC) (Presentation: cephalic; LOA). Infant placed directly on mom's abdomen for bonding/skin-to-skin. Delayed cord clamping x 72min, then cord clamped x 2, and cut by patient. APGAR: 8,9; weight: pending at time of note.  40 units of pitocin diluted in 1000cc LR was infused rapidly IV per protocol. The placenta separated spontaneously and delivered via CCT and maternal pushing effort.  It was inspected and appears to be intact with a 3 VC.  Placenta/Cord with the following complications: none.  Cord pH: n/a  Intrapartum complications:  None Anesthesia:  none Episiotomy: none Lacerations:  none Suture Repair: n/a Est. Blood Loss (mL): 200 Sponge and instrument count were correct x2.  Mom to postpartum.  Baby to Couplet care / Skin to Skin. Placenta to L&D. Plans to breast and bottle feed Contraception: PP [facility] at [facility] [place]: [place], SNM 02/09/2019 7:50 AM

## 2018-04-15 ENCOUNTER — Ambulatory Visit: Payer: Self-pay | Attending: Family Medicine

## 2018-04-18 MED FILL — LISINOPRIL-HCTZ 20-25 MG TA: 20-25 | 30 days supply | Qty: 30 | Fill #3

## 2018-04-18 MED FILL — LEVOTHYROXINE 50 MCG TABLET: 50 | 30 days supply | Qty: 30 | Fill #5

## 2018-04-19 MED FILL — ?CETIRIZINE HCL 10 MG TABLE: 10 | 30 days supply | Qty: 30 | Fill #1

## 2018-04-25 ENCOUNTER — Ambulatory Visit: Payer: Self-pay | Admitting: Family Medicine

## 2018-05-11 ENCOUNTER — Telehealth: Payer: Self-pay | Admitting: Family Medicine

## 2018-05-11 NOTE — Telephone Encounter (Signed)
1) Medication(s) Requested (by name): -levothyroxine (SYNTHROID, LEVOTHROID) 50 MCG tablet   2) Pharmacy of Choice: -[facility] - [place], [place] - [address] 3) Special Requests:   Approved medications will be sent to the pharmacy, we will reach out if there is an issue.  Requests made after 3pm may not be addressed until the following business day!  If a patient is unsure of the name of the medication(s) please note and ask patient to call back when they are able to provide all info, do not send to responsible party until all information is available!

## 2018-05-12 MED FILL — LEVOTHYROXINE 50 MCG TABLET: 50 | 30 days supply | Qty: 30 | Fill #0

## 2018-05-12 NOTE — Telephone Encounter (Signed)
Pt had rx available at the pharmacy, no further action required.

## 2018-05-30 ENCOUNTER — Ambulatory Visit: Payer: Self-pay | Attending: Family Medicine | Admitting: Family Medicine

## 2018-05-30 ENCOUNTER — Encounter: Payer: Self-pay | Admitting: Family Medicine

## 2018-05-30 VITALS — BP 131/82 | HR 83 | Temp 97.8°F | Ht <= 58 in | Wt 142.4 lb

## 2018-05-30 DIAGNOSIS — E038 Other specified hypothyroidism: Secondary | ICD-10-CM

## 2018-05-30 DIAGNOSIS — I10 Essential (primary) hypertension: Secondary | ICD-10-CM

## 2018-05-30 DIAGNOSIS — K2100 Gastro-esophageal reflux disease with esophagitis, without bleeding: Secondary | ICD-10-CM

## 2018-05-30 MED ORDER — LISINOPRIL-HYDROCHLOROTHIAZIDE 20-25 MG PO TABS: 1.0000 | ORAL_TABLET | Freq: Every day | ORAL | 6 refills | Status: DC

## 2018-05-30 MED ORDER — OMEPRAZOLE 20 MG PO CPDR: 20.0000 mg | DELAYED_RELEASE_CAPSULE | Freq: Every day | ORAL | 6 refills | Status: DC

## 2018-05-30 NOTE — Patient Instructions (Addendum)
Enfermedad de reflujo gastroesofgico en los adultos  Gastroesophageal Reflux Disease, Adult  El reflujo gastroesofgico (RGE) ocurre cuando el cido del estmago sube por el tubo que conecta la boca con el estmago (esfago). Normalmente, la comida baja por el esfago y se mantiene en el estmago, donde se la digiere. Cuando una persona tiene RGE, los alimentos y el cido estomacal suelen volver al esfago. Usted puede tener una enfermedad llamada enfermedad de reflujo gastroesofgico (ERGE) si el reflujo:   Sucede a menudo.   Causa sntomas frecuentes o muy intensos.   Causa problemas tales como dao en el esfago.  Cuando esto ocurre, el esfago duele y se hincha (inflama). Con el tiempo, la ERGE puede ocasionar pequeos agujeros (lceras) en el revestimiento del esfago.  Cules son las causas?  Esta afeccin se debe a un problema en el msculo que se encuentra entre el esfago y el estmago. Cuando este msculo est dbil o no es normal, no se cierra correctamente para impedir que los alimentos y el cido regresen del estmago. El msculo puede debilitarse debido a lo siguiente:   El consumo de tabaco.   Embarazo.   Tener cierto tipo de hernia (hernia de hiato).   Consumo de alcohol.   Ciertos alimentos y bebidas, como caf, chocolate, cebollas y menta.  Qu incrementa el riesgo?  Es ms probable que tenga esta afeccin si:   Tiene sobrepeso.   Tiene una enfermedad que afecta el tejido conjuntivo.   Usa antiinflamatorios no esteroideos (AINE).  Cules son los signos o los sntomas?  Los sntomas de esta afeccin incluyen:   Acidez estomacal.   Dificultad o dolor al tragar.   Sensacin de tener un bulto en la garganta.   Sabor amargo en la boca.   Mal aliento.   Tener una gran cantidad de saliva.   Estmago inflamado o con malestar.   Eructos.   Dolor en el pecho. El dolor de pecho puede deberse a distintas afecciones. Asegrese de consultar a su mdico si tiene dolor en el pecho.   Falta  de aire o respiracin ruidosa (sibilancias).   Tos constante (crnica) o durante la noche.   Desgaste de la superficie de los dientes (esmalte dental).   Prdida de peso.  Cmo se trata?  El tratamiento depender de la gravedad de los sntomas. El mdico puede sugerirle lo siguiente:   Cambios en la dieta.   Medicamentos.   Una ciruga.  Siga estas indicaciones en su casa:  Comida y bebida     Siga una dieta como se lo haya indicado el mdico. Es posible que deba evitar alimentos y bebidas, por ejemplo:  ? Caf y t (con o sin cafena).  ? Bebidas que contengan alcohol.  ? Bebidas energticas y deportivas.  ? Bebidas gaseosas y refrescos.  ? Chocolate y cacao.  ? Menta y esencia de menta.  ? Ajo y cebolla.  ? Rbano picante.  ? Alimentos cidos y condimentados. Estos incluyen todos los tipos de pimientos, chile en polvo, curry en polvo, vinagre, salsas picantes y salsa barbacoa.  ? Ctricos y sus jugos, por ejemplo, naranjas, limones y limas.  ? Alimentos que contengan tomate. Estos incluyen salsa roja, chile, salsa picante y pizza con salsa de tomate.  ? Alimentos fritos y grasos. Estos incluyen donas, papas fritas, papitas fritas de bolsa y aderezos con alto contenido de grasa.  ? Carnes con alto contenido de grasa. Estas incluye los perros calientes, chuletas o costillas, embutidos, jamn y tocino.  ?   Productos lcteos ricos en grasas, como leche entera, manteca y queso crema.   Consuma pequeas cantidades de comida con ms frecuencia. Evite consumir porciones abundantes.   Evite beber grandes cantidades de lquidos con las comidas.   Evite comer 2 o 3horas antes de acostarse.   Evite recostarse inmediatamente despus de comer.   No haga ejercicios enseguida despus de comer.  Estilo de vida     No consuma ningn producto que contenga nicotina o tabaco. Estos incluyen cigarrillos, cigarrillos electrnicos y tabaco para mascar. Si necesita ayuda para dejar de fumar, consulte al mdico.   Intente  reducir el nivel de estrs. Si necesita ayuda para hacer esto, consulte al mdico.   Si tiene sobrepeso, baje una cantidad de peso saludable para usted. Consulte a su mdico para bajar de peso de manera segura.  Indicaciones generales   Est atento a cualquier cambio en los sntomas.   Tome los medicamentos de venta libre y los recetados solamente como se lo haya indicado el mdico. No tome aspirina, ibuprofeno ni otros AINE a menos que el mdico lo autorice.   Use ropa holgada. No use nada apretado alrededor de la cintura.   Levante (eleve) la cabecera de la cama aproximadamente 6pulgadas (15cm).   Evite inclinarse si al hacerlo empeoran los sntomas.   Concurra a todas las visitas de seguimiento como se lo haya indicado el mdico. Esto es importante.  Comunquese con un mdico si:   Aparecen nuevos sntomas.   Adelgaza y no sabe por qu.   Tiene problemas para tragar o le duele cuando traga.   Tiene sibilancias o tos persistente.   Los sntomas no mejoran con el tratamiento.   Tiene la voz ronca.  Solicite ayuda inmediatamente si:   Siente dolor en los brazos, el cuello, la mandbula, los dientes o la espalda.   Se siente transpirado, mareado o tiene una sensacin de desvanecimiento.   Siente falta de aire o dolor en el pecho.   Vomita y el vmito tiene un aspecto similar a la sangre o a los posos de caf.   Pierde el conocimiento (se desmaya).   Las deposiciones (heces) son sanguinolentas o negras.   No puede tragar, beber o comer.  Resumen   Si una persona tiene enfermedad de reflujo gastroesofgico (ERGE), los alimentos y el cido estomacal suben al esfago y causan sntomas o problemas tales como dao en el esfago.   El tratamiento depender de la gravedad de los sntomas.   Siga una dieta como se lo haya indicado el mdico.   Tome todos los medicamentos solamente como se lo haya indicado el mdico.  Esta informacin no tiene como fin reemplazar el consejo del mdico. Asegrese de  hacerle al mdico cualquier pregunta que tenga.  Document Released: 05/02/2010 Document Revised: 11/11/2017 Document Reviewed: 11/11/2017  Elsevier Interactive Patient Education  2019 [employer]vier Inc[employer]

## 2018-05-30 NOTE — Progress Notes (Signed)
Subjective:  Patient ID: Sharon Brennan, female    DOB: March 16, 1976  Age: 43 y.o. MRN: [id number]  CC: Hypothyroidism   HPI Sharon Brennan is a 43 year old female with a history of hypertension, hypothyroidism, GERD who presents today to the clinic for a follow-up visit. She has been compliant with omeprazole and avoids spicy foods as they trigger her GERD.  Denies abdominal pain, diarrhea, nausea, vomiting, hematochezia. Doing well on her antihypertensive and denies adverse effects and is also compliant with her levothyroxine. She denies chest pains, dyspnea, pedal edema and tries to adhere to low-sodium diet but does not exercise much. She has no additional concerns today.  Past Medical History:  Diagnosis Date  . Hypertension     Past Surgical History:  Procedure Laterality Date  . CESAREAN SECTION    . CESAREAN SECTION      Family History  Problem Relation Age of Onset  . Hypertension Mother     Allergies  Allergen Reactions  . Pollen Extract     Outpatient Medications Prior to Visit  Medication Sig Dispense Refill  . cetirizine (ZYRTEC) 10 MG tablet Take 1 tablet (10 mg total) by mouth daily. 30 tablet 1  . levothyroxine (SYNTHROID, LEVOTHROID) 50 MCG tablet Take 1 tablet (50 mcg total) by mouth daily before breakfast. Reported on 04/01/2015 30 tablet 3  . lisinopril-hydrochlorothiazide (PRINZIDE,ZESTORETIC) 20-25 MG tablet Take 1 tablet by mouth daily. 30 tablet 3  . omeprazole (PRILOSEC) 20 MG capsule Take 1 capsule (20 mg total) by mouth daily. 30 capsule 2  . nystatin (MYCOSTATIN) 100000 UNIT/ML suspension Take 5 mLs (500,000 Units total) by mouth 4 (four) times daily. (Patient not taking: Reported on 10/28/2017) 30 mL 0   No facility-administered medications prior to visit.      ROS Review of Systems General: negative for fever, weight loss, appetite change Eyes: no visual symptoms. ENT: no ear symptoms, no sinus tenderness, no nasal congestion or  sore throat. Neck: no pain  Respiratory: no wheezing, shortness of breath, cough Cardiovascular: no chest pain, no dyspnea on exertion, no pedal edema, no orthopnea. Gastrointestinal: no abdominal pain, no diarrhea, no constipation Genito-Urinary: no urinary frequency, no dysuria, no polyuria. Hematologic: no bruising Endocrine: no cold or heat intolerance Neurological: no headaches, no seizures, no tremors Musculoskeletal: no joint pains, no joint swelling Skin: no pruritus, no rash. Psychological: no depression, no anxiety,    Objective:  BP 131/82   Pulse 83   Temp 97.8 F (36.6 C) (Oral)   Ht _0  (1.473 m)   Wt 142 lb 6.4 oz (64.6 kg)   SpO2 100%   BMI 29.76 kg/m   BP/Weight 05/30/2018 01/19/2018 4/97/0263  Systolic BP 785 885 027  Diastolic BP 82 82 84  Wt. (Lbs) 142.4 135 142  BMI 29.76 28.22 29.68      Physical Exam Constitutional: normal appearing,  Eyes: PERRLA HEENT: Head is atraumatic, normal sinuses, normal oropharynx, normal appearing tonsils and palate, tympanic membrane is normal bilaterally. Neck: normal range of motion, no thyromegaly, no JVD Cardiovascular: normal rate and rhythm, normal heart sounds, no murmurs, rub or gallop, no pedal edema Respiratory: Normal breath sounds, clear to auscultation bilaterally, no wheezes, no rales, no rhonchi Abdomen: soft, not tender to palpation, normal bowel sounds, no enlarged organs Musculoskeletal: Full ROM, no tenderness in joints Skin: warm and dry, no lesions. Neurological: alert, oriented x3, cranial nerves I-XII grossly intact , normal motor strength, normal sensation. Psychological: normal mood.   CMP Latest Ref  Rng & Units 10/28/2017 03/17/2017 08/11/2016  Glucose 65 - 99 mg/dL 103(H) 99 90  BUN 6 - 24 mg/dL _0 Creatinine 0.57 - 1.00 mg/dL 0.57 0.51(L) 0.62  Sodium 134 - 144 mmol/L 138 142 139  Potassium 3.5 - 5.2 mmol/L 3.9 4.2 4.1  Chloride 96 - 106 mmol/L 99 102 97  CO2 20 - 29 mmol/L _1 Calcium 8.7 - 10.2 mg/dL 9.4 9.5 9.1  Total Protein 6.0 - 8.5 g/dL 7.3 7.4 7.5  Total Bilirubin 0.0 - 1.2 mg/dL 0.2 0.2 <0.2  Alkaline Phos 39 - 117 IU/L 87 106 115  AST 0 - 40 IU/L _2 ALT 0 - 32 IU/L _3 Lipid Panel     Component Value Date/Time   CHOL 184 03/17/2017 1141   TRIG 162 (H) 03/17/2017 1141   HDL 56 03/17/2017 1141   CHOLHDL 3.3 03/17/2017 1141   CHOLHDL 3.0 03/04/2016 0939   VLDL 19 03/04/2016 0939   LDLCALC 96 03/17/2017 1141    CBC    Component Value Date/Time   WBC 13.6 (H) 09/11/2015 2107   RBC 4.54 09/11/2015 2107   HGB 13.6 09/11/2015 2107   HCT 41.0 09/11/2015 2107   PLT 257 09/11/2015 2107   MCV 90.3 09/11/2015 2107   MCH 30.0 09/11/2015 2107   MCHC 33.2 09/11/2015 2107   RDW 12.7 09/11/2015 2107   LYMPHSABS 1.2 11/19/2007 0500   MONOABS 0.3 11/19/2007 0500   EOSABS 0.0 11/19/2007 0500   BASOSABS 0.0 11/19/2007 0500    Lab Results  Component Value Date   HGBA1C 5.90 05/01/2015    Lab Results  Component Value Date   TSH 3.150 10/28/2017    Assessment & Plan:   1. Essential hypertension Controlled Counseled on blood pressure goal of less than 130/80, low-sodium, DASH diet, medication compliance, 150 minutes of moderate intensity exercise per week. Discussed medication compliance, adverse effects. - CMP14+EGFR - Lipid panel - lisinopril-hydrochlorothiazide (PRINZIDE,ZESTORETIC) 20-25 MG tablet; Take 1 tablet by mouth daily.  Dispense: 30 tablet; Refill: 6  2. Gastroesophageal reflux disease with esophagitis Controlled Avoid foods that trigger GERD - omeprazole (PRILOSEC) 20 MG capsule; Take 1 capsule (20 mg total) by mouth daily.  Dispense: 30 capsule; Refill: 6  3. Other specified hypothyroidism Controlled - TSH   Meds ordered this encounter  Medications  . lisinopril-hydrochlorothiazide (PRINZIDE,ZESTORETIC) 20-25 MG tablet    Sig: Take 1 tablet by mouth daily.    Dispense:  30 tablet    Refill:  6  .  omeprazole (PRILOSEC) 20 MG capsule    Sig: Take 1 capsule (20 mg total) by mouth daily.    Dispense:  30 capsule    Refill:  6    Follow-up: Return in about 6 months (around 11/28/2018) for follow up of chronic medical conditions.       [name], MD, FAAFP. [facility] [place], [place]   05/30/2018, 10:29 AM

## 2018-05-31 ENCOUNTER — Telehealth: Payer: Self-pay | Admitting: Family Medicine

## 2018-05-31 LAB — CMP14+EGFR
A/G RATIO: 1.7 (ref 1.2–2.2)
ALK PHOS: 105 IU/L (ref 39–117)
ALT: 15 IU/L (ref 0–32)
AST: 11 IU/L (ref 0–40)
Albumin: 4.9 g/dL — ABNORMAL HIGH (ref 3.8–4.8)
BUN/Creatinine Ratio: 21 (ref 9–23)
BUN: 13 mg/dL (ref 6–24)
Bilirubin Total: 0.3 mg/dL (ref 0.0–1.2)
CALCIUM: 9.7 mg/dL (ref 8.7–10.2)
CHLORIDE: 98 mmol/L (ref 96–106)
CO2: 25 mmol/L (ref 20–29)
Creatinine, Ser: 0.61 mg/dL (ref 0.57–1.00)
GFR calc Af Amer: 129 mL/min/{1.73_m2} (ref >59)
GFR, EST NON AFRICAN AMERICAN: 112 mL/min/{1.73_m2} (ref >59)
GLOBULIN, TOTAL: 2.9 g/dL (ref 1.5–4.5)
Glucose: 100 mg/dL — ABNORMAL HIGH (ref 65–99)
POTASSIUM: 4.4 mmol/L (ref 3.5–5.2)
SODIUM: 140 mmol/L (ref 134–144)
Total Protein: 7.8 g/dL (ref 6.0–8.5)

## 2018-05-31 LAB — LIPID PANEL
Chol/HDL Ratio: 2.6 ratio (ref 0.0–4.4)
Cholesterol, Total: 187 mg/dL (ref 100–199)
HDL: 71 mg/dL (ref >39)
LDL Calculated: 87 mg/dL (ref 0–99)
TRIGLYCERIDES: 143 mg/dL (ref 0–149)
VLDL Cholesterol Cal: 29 mg/dL (ref 5–40)

## 2018-05-31 LAB — TSH: TSH: 2.48 u[IU]/mL (ref 0.450–4.500)

## 2018-05-31 NOTE — Telephone Encounter (Signed)
Will route to PCP for review. 

## 2018-05-31 NOTE — Telephone Encounter (Signed)
Patient wanted to confirm if she should still be taking omeprazole. Please follow up.

## 2018-06-01 ENCOUNTER — Other Ambulatory Visit: Payer: Self-pay | Admitting: Family Medicine

## 2018-06-01 DIAGNOSIS — E038 Other specified hypothyroidism: Secondary | ICD-10-CM

## 2018-06-01 MED ORDER — LEVOTHYROXINE SODIUM 50 MCG PO TABS: 50.0000 ug | ORAL_TABLET | Freq: Every day | ORAL | 3 refills | Status: DC

## 2018-06-01 NOTE — Telephone Encounter (Signed)
Yes she should. 

## 2018-06-02 ENCOUNTER — Telehealth: Payer: Self-pay | Admitting: Family Medicine

## 2018-06-02 NOTE — Telephone Encounter (Signed)
Patient was called and informed of lab results. 

## 2018-06-02 NOTE — Telephone Encounter (Signed)
Pt called in stating she would like an update on her results Please follow up

## 2018-06-17 ENCOUNTER — Telehealth: Payer: Self-pay

## 2018-06-17 NOTE — Telephone Encounter (Signed)
-----   Message from Hoy Register, MD sent at 06/01/2018  9:14 AM EST ----- Cholesterol is normal, thyroid lab is normal

## 2018-06-17 NOTE — Telephone Encounter (Signed)
Patient was called and voicemail is not set up to leave message.

## 2018-06-28 MED FILL — LISINOPRIL-HCTZ 20-25 MG TA: 20-25 | 30 days supply | Qty: 30 | Fill #0

## 2018-06-28 MED FILL — LEVOTHYROXINE 50 MCG TABLET: 50 | 30 days supply | Qty: 30 | Fill #0

## 2018-07-04 ENCOUNTER — Telehealth: Payer: Self-pay | Admitting: Family Medicine

## 2018-07-04 NOTE — Telephone Encounter (Signed)
Pt called in stating that she is pregnant and would like to know if she will be prescribed something new or if her dosage will be lowered for levothyroxine (SYNTHROID, LEVOTHROID) 50 MCG tablet or if it would affect her pregnancy at all.

## 2018-07-05 NOTE — Telephone Encounter (Signed)
Congratulations. Advised to continue current dose of levothyroxine until visit with OB/GYN.  Discontinuing levothyroxine is not advisable as uncontrolled hypothyroidism could affect her pregnancy.

## 2018-07-05 NOTE — Telephone Encounter (Signed)
Will route to PCP for review. 

## 2018-07-06 NOTE — Telephone Encounter (Signed)
Pt called to follow up on her previous call advised pt to continue taking current meds pt understood

## 2018-07-07 ENCOUNTER — Inpatient Hospital Stay (HOSPITAL_COMMUNITY)
Admission: AD | Admit: 2018-07-07 | Discharge: 2018-07-07 | Discharge status: Absent without leave | Payer: Self-pay | Attending: Obstetrics and Gynecology | Admitting: Obstetrics and Gynecology

## 2018-07-07 ENCOUNTER — Other Ambulatory Visit: Payer: Self-pay

## 2018-07-07 ENCOUNTER — Inpatient Hospital Stay (HOSPITAL_COMMUNITY)
Admission: AD | Admit: 2018-07-07 | Discharge: 2018-07-07 | Disposition: A | Discharge status: Home / Self Care | Payer: Self-pay | Attending: Obstetrics and Gynecology | Admitting: Obstetrics and Gynecology

## 2018-07-07 ENCOUNTER — Encounter (HOSPITAL_COMMUNITY): Payer: Self-pay | Admitting: *Deleted

## 2018-07-07 DIAGNOSIS — O26891 Other specified pregnancy related conditions, first trimester: Secondary | ICD-10-CM

## 2018-07-07 DIAGNOSIS — J302 Other seasonal allergic rhinitis: Secondary | ICD-10-CM | POA: Insufficient documentation

## 2018-07-07 DIAGNOSIS — Z79899 Other long term (current) drug therapy: Secondary | ICD-10-CM | POA: Insufficient documentation

## 2018-07-07 DIAGNOSIS — Z3A01 Less than 8 weeks gestation of pregnancy: Secondary | ICD-10-CM | POA: Insufficient documentation

## 2018-07-07 DIAGNOSIS — Z91048 Other nonmedicinal substance allergy status: Secondary | ICD-10-CM | POA: Insufficient documentation

## 2018-07-07 DIAGNOSIS — O34219 Maternal care for unspecified type scar from previous cesarean delivery: Secondary | ICD-10-CM | POA: Insufficient documentation

## 2018-07-07 DIAGNOSIS — Z79890 Hormone replacement therapy (postmenopausal): Secondary | ICD-10-CM | POA: Insufficient documentation

## 2018-07-07 DIAGNOSIS — Z8249 Family history of ischemic heart disease and other diseases of the circulatory system: Secondary | ICD-10-CM | POA: Insufficient documentation

## 2018-07-07 DIAGNOSIS — O10011 Pre-existing essential hypertension complicating pregnancy, first trimester: Secondary | ICD-10-CM | POA: Insufficient documentation

## 2018-07-07 LAB — POCT PREGNANCY, URINE: Preg Test, Ur: POSITIVE — AB

## 2018-07-07 MED ORDER — CETIRIZINE HCL 10 MG PO TABS: 10.0000 mg | ORAL_TABLET | Freq: Every day | ORAL | 3 refills | Status: DC

## 2018-07-07 NOTE — MAU Provider Note (Signed)
Chief Complaint: Sore Throat   First Provider Initiated Contact with Patient 07/07/18 1420    * Spanish interpreter at bedside for this visit*   SUBJECTIVE HPI: Sharon Brennan is a 43 y.o. G5P3010 at [redacted]w[redacted]d who presents to [facility] reporting sore throat.  Symptoms started yesterday. Reports feeling a "tickle" in her throat & itchiness. This feeling makes her sneeze, especially in the morning. States she has seasonal allergies & normally takes zyrtec for her symptoms but stopped taking it when she found out she was pregnant.  Denies ear pain, cough, SOB, fever/chills.  Does reports some intermittent epigastric pain for the last week that is worse after she eats. Has not felt the pain since Monday. No n/v. Denies lower abdominal pain or vaginal bleeding. Has not treated her symptoms.   Past Medical History:  Diagnosis Date  . Hypertension    OB History  Gravida Para Term Preterm AB Living  5 3 3  0 1    SAB TAB Ectopic Multiple Live Births  1 0 0        # Outcome Date GA Lbr Len/2nd Weight Sex Delivery Anes PTL Lv  5 Current           4 Term      Vag-Spont     3 Term      CS-LTranv        Complications: Transverse or oblique fetal presentation  2 Term      Vag-Spont     1 SAB            Past Surgical History:  Procedure Laterality Date  . CESAREAN SECTION    . CESAREAN SECTION     Social History   Socioeconomic History  . Marital status: Single    Spouse name: Not on file  . Number of children: Not on file  . Years of education: Not on file  . Highest education level: Not on file  Occupational History  . Not on file  Social Needs  . Financial resource strain: Not on file  . Food insecurity:    Worry: Not on file    Inability: Not on file  . Transportation needs:    Medical: Not on file    Non-medical: Not on file  Tobacco Use  . Smoking status: Never Smoker  . Smokeless tobacco: Never Used  Substance and Sexual Activity  . Alcohol use: No  . Drug  use: No  . Sexual activity: Never  Lifestyle  . Physical activity:    Days per week: Not on file    Minutes per session: Not on file  . Stress: Not on file  Relationships  . Social connections:    Talks on phone: Not on file    Gets together: Not on file    Attends religious service: Not on file    Active member of club or organization: Not on file    Attends meetings of clubs or organizations: Not on file    Relationship status: Not on file  . Intimate partner violence:    Fear of current or ex partner: Not on file    Emotionally abused: Not on file    Physically abused: Not on file    Forced sexual activity: Not on file  Other Topics Concern  . Not on file  Social History Narrative   ** Merged History Encounter **       Family History  Problem Relation Age of Onset  . Hypertension Mother  No current facility-administered medications on file prior to encounter.    Current Outpatient Medications on File Prior to Encounter  Medication Sig Dispense Refill  . levothyroxine (SYNTHROID, LEVOTHROID) 50 MCG tablet Take 1 tablet (50 mcg total) by mouth daily before breakfast. Reported on 04/01/2015 30 tablet 3  . omeprazole (PRILOSEC) 20 MG capsule Take 1 capsule (20 mg total) by mouth daily. 30 capsule 6   Allergies  Allergen Reactions  . Pollen Extract     I have reviewed patient's Past Medical Hx, Surgical Hx, Family Hx, Social Hx, medications and allergies.   Review of Systems  Constitutional: Negative.   HENT: Positive for postnasal drip, sneezing and sore throat. Negative for congestion, ear pain, rhinorrhea, sinus pain, tinnitus and trouble swallowing.   Respiratory: Negative.   Cardiovascular: Negative.   Gastrointestinal: Positive for abdominal pain (epigastric pain, none currently). Negative for constipation, diarrhea, nausea and vomiting.  Genitourinary: Negative.     OBJECTIVE Patient Vitals for the past 24 hrs:  BP Temp Pulse Resp Height Weight  07/07/18  1453 120/70 - 80 16 - -  07/07/18 1402 125/73 98 F (36.7 C) 89 16 5' (1.524 m) 64.4 kg   Constitutional: Well-developed, well-nourished female in no acute distress.  Cardiovascular: normal rate & rhythm, no murmur Respiratory: normal rate and effort. Lung sounds clear throughout GI: Abd soft, non-tender, Pos BS x 4. No guarding or rebound tenderness MS: Extremities nontender, no edema, normal ROM Neurologic: Alert and oriented x 4.  HEENT: bilateral TM clear. No sinus tenderness. Edematous nares, no rhinorrhea. Oropharynx pink/moist, no exudate or erythema. +PND.    LAB RESULTS Results for orders placed or performed during the hospital encounter of 07/07/18 (from the past 24 hour(s))  Pregnancy, urine POC     Status: Abnormal   Collection Time: 07/07/18  2:09 PM  Result Value Ref Range   Preg Test, Ur POSITIVE (A) NEGATIVE    IMAGING No results found.  MAU COURSE Orders Placed This Encounter  Procedures  . Pregnancy, urine POC  . Discharge patient   Meds ordered this encounter  Medications  . cetirizine (ZYRTEC ALLERGY) 10 MG tablet    Sig: Take 1 tablet (10 mg total) by mouth daily.    Dispense:  30 tablet    Refill:  3    Order Specific Question:   Supervising Provider    Answer:   ERVIN, MICHAEL L [[id number]]    MDM UPT positive. Patient without lower abdominal pain or vaginal bleeding. Reports some intermittent epigastric pain after eating. Discussed reasons to return related to epigastric pain vs OB concerns. Will go to [facility] d/t HTN, AMA, & hypothyroidism.   Patient with no fever/chills, cough, SOB, or COVID contacts. Throat pain described more as itching & most likely r/t seasonal allergies as patient states she gets these symptoms every year. Will give list of OTC meds safe in pregnancy to patient. Refill for zyrtec sent to pharmacy per patient request.   ASSESSMENT 1. Seasonal allergies     PLAN Discharge home in stable condition. Start prenatal care  Follow-up  Information    [facility].   Specialty:  Obstetrics and Gynecology Contact information: [address] [place] [address] [phone]         Allergies as of 07/07/2018      Reactions   Pollen Extract       Medication List    STOP taking these medications   lisinopril-hydrochlorothiazide 20-25 MG  tablet Commonly known as:  PRINZIDE,ZESTORETIC   nystatin 100000 UNIT/ML suspension Commonly known as:  MYCOSTATIN     TAKE these medications   cetirizine 10 MG tablet Commonly known as:  ZyrTEC Allergy Take 1 tablet (10 mg total) by mouth daily.   levothyroxine 50 MCG tablet Commonly known as:  SYNTHROID, LEVOTHROID Take 1 tablet (50 mcg total) by mouth daily before breakfast. Reported on 04/01/2015   omeprazole 20 MG capsule Commonly known as:  PRILOSEC Take 1 capsule (20 mg total) by mouth daily.        Judeth Horn, NP 07/07/2018  3:10 PM

## 2018-07-07 NOTE — MAU Note (Addendum)
Pt presents to MAU with complaints of sore throat since yesterday. States that she has not had a fever. Was told yesterday she was pregnant at the health dept. Abdominal pain on and off. Denies any VB

## 2018-07-07 NOTE — Discharge Instructions (Signed)
Las medicinas seguras para tomar Academic librarian  Safe Medications in Pregnancy  Acn:  Benzoyl Peroxide (Perxido de benzolo)  Salicylic Acid (cido saliclico)  Dolor de espalda/Dolor de cabeza:  Tylenol: 2 pastillas de concentracin regular cada 4 horas O 2 pastillas de concentracin fuerte cada 6 horas  Resfriados/Tos/Alergias:  Benadryl (sin alcohol) 25 mg cada 6 horas segn lo necesite Breath Right strips (Tiras para respirar correctamente)  Claritin  Cepacol (pastillas de chupar para la garganta)  Chloraseptic (aerosol para la garganta)  Cold-Eeze- hasta tres veces por da  Cough drops (pastillas de chupar para la tos, sin alcohol)  Flonase (con receta mdica solamente)  Guaifenesin  Mucinex  Robitussin DM (simple solamente, sin alcohol)  Saline nasal spray/drops (Aerosol nasal salino/gotas) Sudafed (pseudoephedrine) y  Actifed * utilizar slo despus de 12 semanas de gestacin y si no tiene la presin arterial alta.  Tylenol Vicks  VapoRub  Zinc lozenges (pastillas para la garganta)  Zyrtec  Estreimiento:  Colace  Ducolax (supositorios)  Fleet enema (lavado intestinal rectal)  Glycerin (supositorios)  Metamucil  Milk of magnesia (leche de magnesia)  Miralax  Senokot  Smooth Move (t)  Diarrea:  Kaopectate Imodium A-D  *NO tome Pepto-Bismol  Hemorroides:  Anusol  Anusol HC  Preparation H  Tucks  Indigestin:  Tums  Maalox  Mylanta  Zantac  Pepcid  Insomnia:  Benadryl (sin alcohol) 25mg  cada 6 horas segn lo necesite  Tylenol PM  Unisom, no Gelcaps  Calambres en las piernas:  Tums  MagGel Nuseas/Vmitos:  Bonine  Dramamine  Emetrol  Ginger (extracto)  Sea-Bands  Meclizine  Medicina para las nuseas que puede tomar durante el embarazo: Unisom (doxylamine succinate, pastillas de 25 mg) Tome una pastilla al da al [place]. Si los sntomas no estn adecuadamente controlados, la dosis puede aumentarse hasta una dosis mxima recomendada de [employer] al da (1/2 pastilla por la [place], 1/2 pastilla a media tarde y Neomia Dear pastilla al [place]). Pastillas de Vitamina B6 de 100mg . Tome [employer] veces al da (hasta 200 mg por da).  Erupciones en la piel:  Productos de Aveeno  Benadryl cream (crema o una dosis de 25mg  cada 6 horas segn lo necesite)  Calamine Lotion (locin)  1% cortisone cream (crema de cortisona de 1%)  nfeccin vaginal por hongos (candidiasis):  Gyne-lotrimin 7  Monistat 7   **Si est tomando varias medicinas, por favor revise las etiquetas para Art gallery manager los mismos ingredientes [place]. **Tome la medicina segn lo indicado en la etiqueta. **No tome ms de 400 mg de Tylenol en 24 horas. **No tome medicinas que contengan aspirina o ibuprofeno.       Alergias en los adultos Allergies, Adult Una alergia implica la reaccin del organismo ante algo que lo molesta (alrgeno). No se trata de una reaccin normal. Puede ocurrir por algo que usted:  Comi.  Inhal.  Toc. Se pueden tener Environmental consultant (ser alrgico) a:  Cosas que estn al [employer], por ejemplo: ? Polen. ? Csped. ? Hierbas.  Cosas que estn en interiores, por ejemplo: ? Polvo. ? Humo. ? Caspa de las [employer]  Alimentos.  Medicamentos.  Cosas que provocan molestias en la piel, por ejemplo: ? Detergentes. ? Productos qumicos. ? Ltex.  Perfume.  Insectos. Las alergias no se propagan de Neomia Dear persona a Theodoro Clock (no son contagiosas). Siga estas indicaciones en su casa:         Si conoce los factores desencadenantes de su alergia, mantngase alejado de ellos.  Si tiene Environmental consultant a cosas que hay en el aire, lvese la [employer] Hgalo con cualquiera de estas opciones: ? Un atomizador de agua salada (solucin salina). ? Un envase (lavado nasal).  Tome los medicamentos de venta libre y los recetados solamente como se lo haya indicado el mdico.  Oceanographer a todas las visitas de control como se lo haya indicado  el mdico. Esto es importante.  Si tiene riesgo de sufrir una reaccin alrgica muy grave (anafilaxia), tenga a Paediatric nurse en todo momento. Esto se llama inyeccin de epinefrina. ? Es un medicamento medido previamente que viene con [place]. Puede colocrselo en la piel usted mismo. ? Despus de Runner, broadcasting/film/video muy grave, usted o alguna otra persona debe aplicar el medicamento en menos de unos minutos. Esto es Radio broadcast assistant.  Si alguna vez ha tenido Runner, broadcasting/film/video muy grave, debe usar un collar o brazalete de Secondary school teacher. En el collar o el brazalete, debe estar anotada la alergia que usted tiene. Comunquese con un mdico si:  Los sntomas no mejoran con Scientist, research (medical). Solicite ayuda de inmediato si:  Tiene sntomas de una reaccin alrgica muy grave. Estos incluyen lo siguiente: ? Boca, lengua o garganta hinchadas. ? Dolor u opresin en el pecho. ? Problemas para respirar. ? Falta de aire. ? Mareos. ? Desmayos. ? Tiene dolor muy intenso en el vientre (abdomen). ? Vmitos. ? Deposiciones lquidas (diarrea). Resumen  Una alergia implica la reaccin del organismo ante algo que lo molesta (alrgeno). No se trata de una reaccin normal.  Mantngase alejado de los factores desencadenantes de su alergia.  [employer] medicamentos de venta libre y los recetados solamente como se lo haya indicado el mdico.  Si tiene riesgo de sufrir una reaccin alrgica muy grave, tenga a mano un autoinyector en todo momento (inyeccin de epinefrina). Adems, use un collar o brazalete de alerta mdica, para que todos sepan que usted es Best boy. Esta informacin no tiene Theme park manager el consejo del mdico. Asegrese de hacerle al mdico cualquier pregunta que tenga. Document Released: 11/30/2012 Document Revised: 09/22/2016 Document Reviewed: 09/22/2016 Elsevier Interactive Patient Education  [employer].

## 2018-07-25 MED FILL — LEVOTHYROXINE 50 MCG TABLET: 50 | 30 days supply | Qty: 30 | Fill #1

## 2018-08-08 LAB — OB RESULTS CONSOLE GC/CHLAMYDIA
Chlamydia: NEGATIVE
Gonorrhea: NEGATIVE

## 2018-08-08 LAB — OB RESULTS CONSOLE ABO/RH: RH Type: POSITIVE

## 2018-08-08 LAB — CYSTIC FIBROSIS DIAGNOSTIC STUDY: Interpretation-CFDNA:: NEGATIVE

## 2018-08-08 LAB — OB RESULTS CONSOLE HGB/HCT, BLOOD
HCT: 41 (ref 29–41)
Hemoglobin: 13.2

## 2018-08-08 LAB — OB RESULTS CONSOLE ANTIBODY SCREEN: Antibody Screen: NEGATIVE

## 2018-08-08 LAB — OB RESULTS CONSOLE RUBELLA ANTIBODY, IGM: Rubella: IMMUNE

## 2018-08-08 LAB — OB RESULTS CONSOLE VARICELLA ZOSTER ANTIBODY, IGG: Varicella: NON-IMMUNE/NOT IMMUNE

## 2018-08-08 LAB — OB RESULTS CONSOLE HEPATITIS B SURFACE ANTIGEN: Hepatitis B Surface Ag: NEGATIVE

## 2018-08-08 LAB — GLUCOSE, 1 HOUR: Glucose 1 Hour: 119

## 2018-08-08 LAB — CULTURE, OB URINE: Urine Culture, OB: NEGATIVE

## 2018-08-08 LAB — OB RESULTS CONSOLE HIV ANTIBODY (ROUTINE TESTING): HIV: NONREACTIVE

## 2018-08-08 LAB — OB RESULTS CONSOLE RPR: RPR: NONREACTIVE

## 2018-08-08 LAB — OB RESULTS CONSOLE PLATELET COUNT: Platelets: 261

## 2018-08-10 ENCOUNTER — Other Ambulatory Visit (HOSPITAL_COMMUNITY): Payer: Self-pay | Admitting: Nurse Practitioner

## 2018-08-10 DIAGNOSIS — Z3682 Encounter for antenatal screening for nuchal translucency: Secondary | ICD-10-CM

## 2018-08-17 ENCOUNTER — Encounter (HOSPITAL_COMMUNITY): Payer: Self-pay

## 2018-08-18 MED FILL — LEVOTHYROXINE 50 MCG TABLET: 50 | 30 days supply | Qty: 30 | Fill #2

## 2018-08-22 ENCOUNTER — Encounter (HOSPITAL_COMMUNITY): Payer: Self-pay

## 2018-08-22 ENCOUNTER — Other Ambulatory Visit: Payer: Self-pay

## 2018-08-22 ENCOUNTER — Ambulatory Visit (HOSPITAL_COMMUNITY): Payer: Self-pay | Admitting: *Deleted

## 2018-08-22 ENCOUNTER — Ambulatory Visit (HOSPITAL_COMMUNITY): Payer: Self-pay

## 2018-08-22 ENCOUNTER — Telehealth: Payer: Self-pay | Admitting: Obstetrics & Gynecology

## 2018-08-22 ENCOUNTER — Ambulatory Visit (HOSPITAL_COMMUNITY)
Admission: RE | Admit: 2018-08-22 | Discharge: 2018-08-22 | Disposition: A | Discharge status: Home / Self Care | Payer: Medicaid Other | Source: Ambulatory Visit | Attending: Obstetrics and Gynecology | Admitting: Obstetrics and Gynecology

## 2018-08-22 VITALS — BP 137/78 | HR 96 | Temp 98.7°F | Wt 150.6 lb

## 2018-08-22 DIAGNOSIS — O99281 Endocrine, nutritional and metabolic diseases complicating pregnancy, first trimester: Secondary | ICD-10-CM

## 2018-08-22 DIAGNOSIS — O34219 Maternal care for unspecified type scar from previous cesarean delivery: Secondary | ICD-10-CM | POA: Diagnosis not present

## 2018-08-22 DIAGNOSIS — O09529 Supervision of elderly multigravida, unspecified trimester: Secondary | ICD-10-CM

## 2018-08-22 DIAGNOSIS — O09521 Supervision of elderly multigravida, first trimester: Secondary | ICD-10-CM | POA: Diagnosis not present

## 2018-08-22 DIAGNOSIS — E039 Hypothyroidism, unspecified: Secondary | ICD-10-CM

## 2018-08-22 DIAGNOSIS — O10011 Pre-existing essential hypertension complicating pregnancy, first trimester: Secondary | ICD-10-CM

## 2018-08-22 DIAGNOSIS — O09523 Supervision of elderly multigravida, third trimester: Secondary | ICD-10-CM

## 2018-08-22 DIAGNOSIS — Z3A12 12 weeks gestation of pregnancy: Secondary | ICD-10-CM

## 2018-08-22 DIAGNOSIS — Z3682 Encounter for antenatal screening for nuchal translucency: Secondary | ICD-10-CM | POA: Insufficient documentation

## 2018-08-22 NOTE — Telephone Encounter (Signed)
Called the patient with the in office interrupter, reiceved a message the person you are calling has a voicemail box that has not been setup yet.

## 2018-08-25 ENCOUNTER — Ambulatory Visit (INDEPENDENT_AMBULATORY_CARE_PROVIDER_SITE_OTHER): Payer: Self-pay | Admitting: Obstetrics and Gynecology

## 2018-08-25 ENCOUNTER — Encounter: Payer: Self-pay | Admitting: Obstetrics and Gynecology

## 2018-08-25 ENCOUNTER — Other Ambulatory Visit: Payer: Self-pay

## 2018-08-25 VITALS — BP 135/82 | HR 91 | Wt 151.5 lb

## 2018-08-25 DIAGNOSIS — O10919 Unspecified pre-existing hypertension complicating pregnancy, unspecified trimester: Secondary | ICD-10-CM | POA: Insufficient documentation

## 2018-08-25 DIAGNOSIS — Z3A13 13 weeks gestation of pregnancy: Secondary | ICD-10-CM

## 2018-08-25 DIAGNOSIS — O0990 Supervision of high risk pregnancy, unspecified, unspecified trimester: Secondary | ICD-10-CM | POA: Insufficient documentation

## 2018-08-25 DIAGNOSIS — O99281 Endocrine, nutritional and metabolic diseases complicating pregnancy, first trimester: Secondary | ICD-10-CM

## 2018-08-25 DIAGNOSIS — O09529 Supervision of elderly multigravida, unspecified trimester: Secondary | ICD-10-CM | POA: Insufficient documentation

## 2018-08-25 DIAGNOSIS — E039 Hypothyroidism, unspecified: Secondary | ICD-10-CM

## 2018-08-25 DIAGNOSIS — O09521 Supervision of elderly multigravida, first trimester: Secondary | ICD-10-CM

## 2018-08-25 DIAGNOSIS — Z98891 History of uterine scar from previous surgery: Secondary | ICD-10-CM | POA: Insufficient documentation

## 2018-08-25 DIAGNOSIS — O10911 Unspecified pre-existing hypertension complicating pregnancy, first trimester: Secondary | ICD-10-CM

## 2018-08-25 DIAGNOSIS — O34219 Maternal care for unspecified type scar from previous cesarean delivery: Secondary | ICD-10-CM

## 2018-08-25 DIAGNOSIS — O09522 Supervision of elderly multigravida, second trimester: Secondary | ICD-10-CM

## 2018-08-25 MED ORDER — ASPIRIN EC 81 MG PO TBEC: 81.0000 mg | DELAYED_RELEASE_TABLET | Freq: Every day | ORAL | 2 refills | Status: DC

## 2018-08-25 NOTE — Progress Notes (Signed)
Pt given a [place] issued blood pressure cuff and instructed on how and when to take her blood pressure. Pt has no questions or concerns at this time.

## 2018-08-25 NOTE — Patient Instructions (Signed)
° °Second Trimester of Pregnancy °The second trimester is from week 14 through week 27 (months 4 through 6). The second trimester is often a time when you feel your best. Your body has adjusted to being pregnant, and you begin to feel better physically. Usually, morning sickness has lessened or quit completely, you may have more energy, and you may have an increase in appetite. The second trimester is also a time when the fetus is growing rapidly. At the end of the sixth month, the fetus is about 9 inches long and weighs about 1½ pounds. You will likely begin to feel the baby move (quickening) between 16 and 20 weeks of pregnancy. °Body changes during your second trimester °Your body continues to go through many changes during your second trimester. The changes vary from woman to woman. °· Your weight will continue to increase. You will notice your lower abdomen bulging out. °· You may begin to get stretch marks on your hips, abdomen, and breasts. °· You may develop headaches that can be relieved by medicines. The medicines should be approved by your health care provider. °· You may urinate more often because the fetus is pressing on your bladder. °· You may develop or continue to have heartburn as a result of your pregnancy. °· You may develop constipation because certain hormones are causing the muscles that push waste through your intestines to slow down. °· You may develop hemorrhoids or swollen, bulging veins (varicose veins). °· You may have back pain. This is caused by: °? Weight gain. °? Pregnancy hormones that are relaxing the joints in your pelvis. °? A shift in weight and the muscles that support your balance. °· Your breasts will continue to grow and they will continue to become tender. °· Your gums may bleed and may be sensitive to brushing and flossing. °· Dark spots or blotches (chloasma, mask of pregnancy) may develop on your face. This will likely fade after the baby is born. °· A dark line from  your belly button to the pubic area (linea nigra) may appear. This will likely fade after the baby is born. °· You may have changes in your hair. These can include thickening of your hair, rapid growth, and changes in texture. Some women also have hair loss during or after pregnancy, or hair that feels dry or thin. Your hair will most likely return to normal after your baby is born. °What to expect at prenatal visits °During a routine prenatal visit: °· You will be weighed to make sure you and the fetus are growing normally. °· Your blood pressure will be taken. °· Your abdomen will be measured to track your baby's growth. °· The fetal heartbeat will be listened to. °· Any test results from the previous visit will be discussed. °Your health care provider may ask you: °· How you are feeling. °· If you are feeling the baby move. °· If you have had any abnormal symptoms, such as leaking fluid, bleeding, severe headaches, or abdominal cramping. °· If you are using any tobacco products, including cigarettes, chewing tobacco, and electronic cigarettes. °· If you have any questions. °Other tests that may be performed during your second trimester include: °· Blood tests that check for: °? Low iron levels (anemia). °? High blood sugar that affects pregnant women (gestational diabetes) between 24 and 28 weeks. °? Rh antibodies. This is to check for a protein on red blood cells (Rh factor). °· Urine tests to check for infections, diabetes, or protein in   the urine. °· An ultrasound to confirm the proper growth and development of the baby. °· An amniocentesis to check for possible genetic problems. °· Fetal screens for spina bifida and Down syndrome. °· HIV (human immunodeficiency virus) testing. Routine prenatal testing includes screening for HIV, unless you choose not to have this test. °Follow these instructions at home: °Medicines °· Follow your health care provider's instructions regarding medicine use. Specific medicines  may be either safe or unsafe to take during pregnancy. °· Take a prenatal vitamin that contains at least 600 micrograms (mcg) of folic acid. °· If you develop constipation, try taking a stool softener if your health care provider approves. °Eating and drinking ° °· Eat a balanced diet that includes fresh fruits and vegetables, whole grains, good sources of protein such as meat, eggs, or tofu, and low-fat dairy. Your health care provider will help you determine the amount of weight gain that is right for you. °· Avoid raw meat and uncooked cheese. These carry germs that can cause birth defects in the baby. °· If you have low calcium intake from food, talk to your health care provider about whether you should take a daily calcium supplement. °· Limit foods that are high in fat and processed sugars, such as fried and sweet foods. °· To prevent constipation: °? Drink enough fluid to keep your urine clear or pale yellow. °? Eat foods that are high in fiber, such as fresh fruits and vegetables, whole grains, and beans. °Activity °· Exercise only as directed by your health care provider. Most women can continue their usual exercise routine during pregnancy. Try to exercise for 30 minutes at least 5 days a week. Stop exercising if you experience uterine contractions. °· Avoid heavy lifting, wear low heel shoes, and practice good posture. °· A sexual relationship may be continued unless your health care provider directs you otherwise. °Relieving pain and discomfort °· Wear a good support bra to prevent discomfort from breast tenderness. °· Take warm sitz baths to soothe any pain or discomfort caused by hemorrhoids. Use hemorrhoid cream if your health care provider approves. °· Rest with your legs elevated if you have leg cramps or low back pain. °· If you develop varicose veins, wear support hose. Elevate your feet for 15 minutes, 3-4 times a day. Limit salt in your diet. °Prenatal Care °· Write down your questions. Take  them to your prenatal visits. °· Keep all your prenatal visits as told by your health care provider. This is important. °Safety °· Wear your seat belt at all times when driving. °· Make a list of emergency phone numbers, including numbers for family, friends, the hospital, and police and fire departments. °General instructions °· Ask your health care provider for a referral to a local prenatal education class. Begin classes no later than the beginning of month 6 of your pregnancy. °· Ask for help if you have counseling or nutritional needs during pregnancy. Your health care provider can offer advice or refer you to specialists for help with various needs. °· Do not use hot tubs, steam rooms, or saunas. °· Do not douche or use tampons or scented sanitary pads. °· Do not cross your legs for long periods of time. °· Avoid cat litter boxes and soil used by cats. These carry germs that can cause birth defects in the baby and possibly loss of the fetus by miscarriage or stillbirth. °· Avoid all smoking, herbs, alcohol, and unprescribed drugs. Chemicals in these products can affect the   formation and growth of the baby. °· Do not use any products that contain nicotine or tobacco, such as cigarettes and e-cigarettes. If you need help quitting, ask your health care provider. °· Visit your dentist if you have not gone yet during your pregnancy. Use a soft toothbrush to brush your teeth and be gentle when you floss. °Contact a health care provider if: °· You have dizziness. °· You have mild pelvic cramps, pelvic pressure, or nagging pain in the abdominal area. °· You have persistent nausea, vomiting, or diarrhea. °· You have a bad smelling vaginal discharge. °· You have pain when you urinate. °Get help right away if: °· You have a fever. °· You are leaking fluid from your vagina. °· You have spotting or bleeding from your vagina. °· You have severe abdominal cramping or pain. °· You have rapid weight gain or weight loss. °· You  have shortness of breath with chest pain. °· You notice sudden or extreme swelling of your face, hands, ankles, feet, or legs. °· You have not felt your baby move in over an hour. °· You have severe headaches that do not go away when you take medicine. °· You have vision changes. °Summary °· The second trimester is from week 14 through week 27 (months 4 through 6). It is also a time when the fetus is growing rapidly. °· Your body goes through many changes during pregnancy. The changes vary from woman to woman. °· Avoid all smoking, herbs, alcohol, and unprescribed drugs. These chemicals affect the formation and growth your baby. °· Do not use any tobacco products, such as cigarettes, chewing tobacco, and e-cigarettes. If you need help quitting, ask your health care provider. °· Contact your health care provider if you have any questions. Keep all prenatal visits as told by your health care provider. This is important. °This information is not intended to replace advice given to you by your health care provider. Make sure you discuss any questions you have with your health care provider. °Document Released: 03/24/2001 Document Revised: 05/05/2016 Document Reviewed: 05/05/2016 °[employer] Interactive Patient Education © 2019 [employer] ° ° °Contraception Choices °Contraception, also called birth control, refers to methods or devices that prevent pregnancy. °Hormonal methods °Contraceptive implant ° °A contraceptive implant is a thin, plastic tube that contains a hormone. It is inserted into the upper part of the arm. It can remain in place for up to 3 years. °Progestin-only injections °Progestin-only injections are injections of progestin, a synthetic form of the hormone progesterone. They are given every 3 months by a health care provider. °Birth control pills ° °Birth control pills are pills that contain hormones that prevent pregnancy. They must be taken once a day, preferably at the same time each day. °Birth  control patch ° °The birth control patch contains hormones that prevent pregnancy. It is placed on the skin and must be changed once a week for three weeks and removed on the fourth week. A prescription is needed to use this method of contraception. °Vaginal ring ° °A vaginal ring contains hormones that prevent pregnancy. It is placed in the vagina for three weeks and removed on the fourth week. After that, the process is repeated with a new ring. A prescription is needed to use this method of contraception. °Emergency contraceptive °Emergency contraceptives prevent pregnancy after unprotected sex. They come in pill form and can be taken up to 5 days after sex. They work best the sooner they are taken after having sex. Most   emergency contraceptives are available without a prescription. This method should not be used as your only form of birth control. °Barrier methods °Female condom ° °A female condom is a thin sheath that is worn over the penis during sex. Condoms keep sperm from going inside a woman's body. They can be used with a spermicide to increase their effectiveness. They should be disposed after a single use. °Female condom ° °A female condom is a soft, loose-fitting sheath that is put into the vagina before sex. The condom keeps sperm from going inside a woman's body. They should be disposed after a single use. °Diaphragm ° °A diaphragm is a soft, dome-shaped barrier. It is inserted into the vagina before sex, along with a spermicide. The diaphragm blocks sperm from entering the uterus, and the spermicide kills sperm. A diaphragm should be left in the vagina for 6-8 hours after sex and removed within 24 hours. °A diaphragm is prescribed and fitted by a health care provider. A diaphragm should be replaced every 1-2 years, after giving birth, after gaining more than 15 lb (6.8 kg), and after pelvic surgery. °Cervical cap ° °A cervical cap is a round, soft latex or plastic cup that fits over the cervix. It is  inserted into the vagina before sex, along with spermicide. It blocks sperm from entering the uterus. The cap should be left in place for 6-8 hours after sex and removed within 48 hours. A cervical cap must be prescribed and fitted by a health care provider. It should be replaced every 2 years. °Sponge ° °A sponge is a soft, circular piece of polyurethane foam with spermicide on it. The sponge helps block sperm from entering the uterus, and the spermicide kills sperm. To use it, you make it wet and then insert it into the vagina. It should be inserted before sex, left in for at least 6 hours after sex, and removed and thrown away within 30 hours. °Spermicides °Spermicides are chemicals that kill or block sperm from entering the cervix and uterus. They can come as a cream, jelly, suppository, foam, or tablet. A spermicide should be inserted into the vagina with an applicator at least 10-15 minutes before sex to allow time for it to work. The process must be repeated every time you have sex. Spermicides do not require a prescription. °Intrauterine contraception °Intrauterine device (IUD) °An IUD is a T-shaped device that is put in a woman's uterus. There are two types: °· Hormone IUD.This type contains progestin, a synthetic form of the hormone progesterone. This type can stay in place for 3-5 years. °· Copper IUD.This type is wrapped in copper wire. It can stay in place for 10 years. ° °Permanent methods of contraception °Female tubal ligation °In this method, a woman's fallopian tubes are sealed, tied, or blocked during surgery to prevent eggs from traveling to the uterus. °Hysteroscopic sterilization °In this method, a small, flexible insert is placed into each fallopian tube. The inserts cause scar tissue to form in the fallopian tubes and block them, so sperm cannot reach an egg. The procedure takes about 3 months to be effective. Another form of birth control must be used during those 3 months. °Female  sterilization °This is a procedure to tie off the tubes that carry sperm (vasectomy). After the procedure, the man can still ejaculate fluid (semen). °Natural planning methods °Natural family planning °In this method, a couple does not have sex on days when the woman could become pregnant. °Calendar method °This means keeping   track of the length of each menstrual cycle, identifying the days when pregnancy can happen, and not having sex on those days. °Ovulation method °In this method, a couple avoids sex during ovulation. °Symptothermal method °This method involves not having sex during ovulation. The woman typically checks for ovulation by watching changes in her temperature and in the consistency of cervical mucus. °Post-ovulation method °In this method, a couple waits to have sex until after ovulation. °Summary °· Contraception, also called birth control, means methods or devices that prevent pregnancy. °· Hormonal methods of contraception include implants, injections, pills, patches, vaginal rings, and emergency contraceptives. °· Barrier methods of contraception can include female condoms, female condoms, diaphragms, cervical caps, sponges, and spermicides. °· There are two types of IUDs (intrauterine devices). An IUD can be put in a woman's uterus to prevent pregnancy for 3-5 years. °· Permanent sterilization can be done through a procedure for males, females, or both. °· Natural family planning methods involve not having sex on days when the woman could become pregnant. °This information is not intended to replace advice given to you by your health care provider. Make sure you discuss any questions you have with your health care provider. °Document Released: 03/30/2005 Document Revised: 04/01/2017 Document Reviewed: 05/02/2016 °[employer] Interactive Patient Education © 2019 [employer] ° ° °Breastfeeding ° °Choosing to breastfeed is one of the best decisions you can make for yourself and your baby. A change in  hormones during pregnancy causes your breasts to make breast milk in your milk-producing glands. Hormones prevent breast milk from being released before your baby is born. They also prompt milk flow after birth. Once breastfeeding has begun, thoughts of your baby, as well as his or her sucking or crying, can stimulate the release of milk from your milk-producing glands. °Benefits of breastfeeding °Research shows that breastfeeding offers many health benefits for infants and mothers. It also offers a cost-free and convenient way to feed your baby. °For your baby °· Your first milk (colostrum) helps your baby's digestive system to function better. °· Special cells in your milk (antibodies) help your baby to fight off infections. °· Breastfed babies are less likely to develop asthma, allergies, obesity, or type 2 diabetes. They are also at lower risk for sudden infant death syndrome (SIDS). °· Nutrients in breast milk are better able to meet your baby’s needs compared to infant formula. °· Breast milk improves your baby's brain development. °For you °· Breastfeeding helps to create a very special bond between you and your baby. °· Breastfeeding is convenient. Breast milk costs nothing and is always available at the correct temperature. °· Breastfeeding helps to burn calories. It helps you to lose the weight that you gained during pregnancy. °· Breastfeeding makes your uterus return faster to its size before pregnancy. It also slows bleeding (lochia) after you give birth. °· Breastfeeding helps to lower your risk of developing type 2 diabetes, osteoporosis, rheumatoid arthritis, cardiovascular disease, and breast, ovarian, uterine, and endometrial cancer later in life. °Breastfeeding basics °Starting breastfeeding °· Find a comfortable place to sit or lie down, with your neck and back well-supported. °· Place a pillow or a rolled-up blanket under your baby to bring him or her to the level of your breast (if you are  seated). Nursing pillows are specially designed to help support your arms and your baby while you breastfeed. °· Make sure that your baby's tummy (abdomen) is facing your abdomen. °· Gently massage your breast. With your fingertips, massage from   the outer edges of your breast inward toward the nipple. This encourages milk flow. If your milk flows slowly, you may need to continue this action during the feeding. °· Support your breast with 4 fingers underneath and your thumb above your nipple (make the letter "C" with your hand). Make sure your fingers are well away from your nipple and your baby’s mouth. °· Stroke your baby's lips gently with your finger or nipple. °· When your baby's mouth is open wide enough, quickly bring your baby to your breast, placing your entire nipple and as much of the areola as possible into your baby's mouth. The areola is the colored area around your nipple. °? More areola should be visible above your baby's upper lip than below the lower lip. °? Your baby's lips should be opened and extended outward (flanged) to ensure an adequate, comfortable latch. °? Your baby's tongue should be between his or her lower gum and your breast. °· Make sure that your baby's mouth is correctly positioned around your nipple (latched). Your baby's lips should create a seal on your breast and be turned out (everted). °· It is common for your baby to suck about 2-3 minutes in order to start the flow of breast milk. °Latching °Teaching your baby how to latch onto your breast properly is very important. An improper latch can cause nipple pain, decreased milk supply, and poor weight gain in your baby. Also, if your baby is not latched onto your nipple properly, he or she may swallow some air during feeding. This can make your baby fussy. Burping your baby when you switch breasts during the feeding can help to get rid of the air. However, teaching your baby to latch on properly is still the best way to prevent  fussiness from swallowing air while breastfeeding. °Signs that your baby has successfully latched onto your nipple °· Silent tugging or silent sucking, without causing you pain. Infant's lips should be extended outward (flanged). °· Swallowing heard between every 3-4 sucks once your milk has started to flow (after your let-down milk reflex occurs). °· Muscle movement above and in front of his or her ears while sucking. °Signs that your baby has not successfully latched onto your nipple °· Sucking sounds or smacking sounds from your baby while breastfeeding. °· Nipple pain. °If you think your baby has not latched on correctly, slip your finger into the corner of your baby’s mouth to break the suction and place it between your baby's gums. Attempt to start breastfeeding again. °Signs of successful breastfeeding °Signs from your baby °· Your baby will gradually decrease the number of sucks or will completely stop sucking. °· Your baby will fall asleep. °· Your baby's body will relax. °· Your baby will retain a small amount of milk in his or her mouth. °· Your baby will let go of your breast by himself or herself. °Signs from you °· Breasts that have increased in firmness, weight, and size 1-3 hours after feeding. °· Breasts that are softer immediately after breastfeeding. °· Increased milk volume, as well as a change in milk consistency and color by the fifth day of breastfeeding. °· Nipples that are not sore, cracked, or bleeding. °Signs that your baby is getting enough milk °· Wetting at least 1-2 diapers during the first 24 hours after birth. °· Wetting at least 5-6 diapers every 24 hours for the first week after birth. The urine should be clear or pale yellow by the age of 5 days. °·   Wetting 6-8 diapers every 24 hours as your baby continues to grow and develop. °· At least 3 stools in a 24-hour period by the age of 5 days. The stool should be soft and yellow. °· At least 3 stools in a 24-hour period by the age of 7  days. The stool should be seedy and yellow. °· No loss of weight greater than 10% of birth weight during the first 3 days of life. °· Average weight gain of 4-7 oz (113-198 g) per week after the age of 4 days. °· Consistent daily weight gain by the age of 5 days, without weight loss after the age of 2 weeks. °After a feeding, your baby may spit up a small amount of milk. This is normal. °Breastfeeding frequency and duration °Frequent feeding will help you make more milk and can prevent sore nipples and extremely full breasts (breast engorgement). Breastfeed when you feel the need to reduce the fullness of your breasts or when your baby shows signs of hunger. This is called "breastfeeding on demand." Signs that your baby is hungry include: °· Increased alertness, activity, or restlessness. °· Movement of the head from side to side. °· Opening of the mouth when the corner of the mouth or cheek is stroked (rooting). °· Increased sucking sounds, smacking lips, cooing, sighing, or squeaking. °· Hand-to-mouth movements and sucking on fingers or hands. °· Fussing or crying. °Avoid introducing a pacifier to your baby in the first 4-6 weeks after your baby is born. After this time, you may choose to use a pacifier. Research has shown that pacifier use during the first year of a baby's life decreases the risk of sudden infant death syndrome (SIDS). °Allow your baby to feed on each breast as long as he or she wants. When your baby unlatches or falls asleep while feeding from the first breast, offer the second breast. Because newborns are often sleepy in the first few weeks of life, you may need to awaken your baby to get him or her to feed. °Breastfeeding times will vary from baby to baby. However, the following rules can serve as a guide to help you make sure that your baby is properly fed: °· Newborns (babies 4 weeks of age or younger) may breastfeed every 1-3 hours. °· Newborns should not go without breastfeeding for longer  than 3 hours during the day or 5 hours during the night. °· You should breastfeed your baby a minimum of 8 times in a 24-hour period. °Breast milk pumping ° °  ° °Pumping and storing breast milk allows you to make sure that your baby is exclusively fed your breast milk, even at times when you are unable to breastfeed. This is especially important if you go back to work while you are still breastfeeding, or if you are not able to be present during feedings. Your lactation consultant can help you find a method of pumping that works best for you and give you guidelines about how long it is safe to store breast milk. °Caring for your breasts while you breastfeed °Nipples can become dry, cracked, and sore while breastfeeding. The following recommendations can help keep your breasts moisturized and healthy: °· Avoid using soap on your nipples. °· Wear a supportive bra designed especially for nursing. Avoid wearing underwire-style bras or extremely tight bras (sports bras). °· Air-dry your nipples for 3-4 minutes after each feeding. °· Use only cotton bra pads to absorb leaked breast milk. Leaking of breast milk between feedings is   normal. °· Use lanolin on your nipples after breastfeeding. Lanolin helps to maintain your skin's normal moisture barrier. Pure lanolin is not harmful (not toxic) to your baby. You may also hand express a few drops of breast milk and gently massage that milk into your nipples and allow the milk to air-dry. °In the first few weeks after giving birth, some women experience breast engorgement. Engorgement can make your breasts feel heavy, warm, and tender to the touch. Engorgement peaks within 3-5 days after you give birth. The following recommendations can help to ease engorgement: °· Completely empty your breasts while breastfeeding or pumping. You may want to start by applying warm, moist heat (in the shower or with warm, water-soaked hand towels) just before feeding or pumping. This increases  circulation and helps the milk flow. If your baby does not completely empty your breasts while breastfeeding, pump any extra milk after he or she is finished. °· Apply ice packs to your breasts immediately after breastfeeding or pumping, unless this is too uncomfortable for you. To do this: °? Put ice in a plastic bag. °? Place a towel between your skin and the bag. °? Leave the ice on for 20 minutes, 2-3 times a day. °· Make sure that your baby is latched on and positioned properly while breastfeeding. °If engorgement persists after 48 hours of following these recommendations, contact your health care provider or a lactation consultant. °Overall health care recommendations while breastfeeding °· Eat 3 healthy meals and 3 snacks every day. Well-nourished mothers who are breastfeeding need an additional 450-500 calories a day. You can meet this requirement by increasing the amount of a balanced diet that you eat. °· Drink enough water to keep your urine pale yellow or clear. °· Rest often, relax, and continue to take your prenatal vitamins to prevent fatigue, stress, and low vitamin and mineral levels in your body (nutrient deficiencies). °· Do not use any products that contain nicotine or tobacco, such as cigarettes and e-cigarettes. Your baby may be harmed by chemicals from cigarettes that pass into breast milk and exposure to secondhand smoke. If you need help quitting, ask your health care provider. °· Avoid alcohol. °· Do not use illegal drugs or marijuana. °· Talk with your health care provider before taking any medicines. These include over-the-counter and prescription medicines as well as vitamins and herbal supplements. Some medicines that may be harmful to your baby can pass through breast milk. °· It is possible to become pregnant while breastfeeding. If birth control is desired, ask your health care provider about options that will be safe while breastfeeding your baby. °Where to find more  information: °[employer]: www.llli.org °Contact a health care provider if: °· You feel like you want to stop breastfeeding or have become frustrated with breastfeeding. °· Your nipples are cracked or bleeding. °· Your breasts are red, tender, or warm. °· You have: °? Painful breasts or nipples. °? A swollen area on either breast. °? A fever or chills. °? Nausea or vomiting. °? Drainage other than breast milk from your nipples. °· Your breasts do not become full before feedings by the fifth day after you give birth. °· You feel sad and depressed. °· Your baby is: °? Too sleepy to eat well. °? Having trouble sleeping. °? More than 1 week old and wetting fewer than 6 diapers in a 24-hour period. °? Not gaining weight by 5 days of age. °· Your baby has fewer than 3 stools in a   24-hour period. °· Your baby's skin or the white parts of his or her eyes become yellow. °Get help right away if: °· Your baby is overly tired (lethargic) and does not want to wake up and feed. °· Your baby develops an unexplained fever. °Summary °· Breastfeeding offers many health benefits for infant and mothers. °· Try to breastfeed your infant when he or she shows early signs of hunger. °· Gently tickle or stroke your baby's lips with your finger or nipple to allow the baby to open his or her mouth. Bring the baby to your breast. Make sure that much of the areola is in your baby's mouth. Offer one side and burp the baby before you offer the other side. °· Talk with your health care provider or lactation consultant if you have questions or you face problems as you breastfeed. °This information is not intended to replace advice given to you by your health care provider. Make sure you discuss any questions you have with your health care provider. °Document Released: 03/30/2005 Document Revised: 05/01/2016 Document Reviewed: 05/01/2016 °Elsevier Interactive Patient Education © 2019 Elsevier Inc. ° °

## 2018-08-25 NOTE — Progress Notes (Signed)
  Subjective:    Sharon Brennan is a [id number] [redacted]w[redacted]d being seen today for her first obstetrical visit. Patient is transferring care from the health department  Her obstetrical history is significant for hypothyroidism, advanced maternal age and CHTN, previous c/section followed by TOLAC. Patient does intend to breast feed. Pregnancy history fully reviewed.  Patient reports no complaints.  Vitals:   08/25/18 1402  BP: 135/82  Pulse: 91  Weight: 151 lb 8 oz (68.7 kg)    HISTORY: OB History  Gravida Para Term Preterm AB Living  5 3 3  0 1 3  SAB TAB Ectopic Multiple Live Births  1 0 0        # Outcome Date GA Lbr Len/2nd Weight Sex Delivery Anes PTL Lv  5 Current           4 Term      Vag-Spont     3 Term      CS-LTranv        Complications: Transverse or oblique fetal presentation  2 Term      Vag-Spont     1 SAB            Past Medical History:  Diagnosis Date  . Hypertension    Past Surgical History:  Procedure Laterality Date  . CESAREAN SECTION    . CESAREAN SECTION     Family History  Problem Relation Age of Onset  . Hypertension Mother      Exam        Assessment:    Pregnancy: [id number] Patient Active Problem List   Diagnosis Date Noted  . Previous cesarean section complicating pregnancy 08/25/2018  . Supervision of high risk pregnancy, antepartum 08/25/2018  . Chronic hypertension during pregnancy, antepartum 08/25/2018  . AMA (advanced maternal age) multigravida 35+ 08/25/2018  . GERD (gastroesophageal reflux disease) 10/28/2017  . Hypothyroidism affecting pregnancy 04/01/2015  . Hypertension 01/22/2011  . LIVER FUNCTION TESTS, ABNORMAL, HX OF 11/25/2007        Plan:     Initial labs reviewed. Prenatal vitamins. Problem list reviewed and updated. Genetic Screening discussed : Mat 21 collected on 08/22/18.  Ultrasound discussed; fetal survey: ordered. Baseline labs and TSH ordered Rx ASA provided  Follow up in 4 weeks. 50% of 30 min  visit spent on counseling and coordination of care.     Sharon Brennan 08/25/2018

## 2018-08-26 ENCOUNTER — Encounter: Payer: Self-pay | Admitting: *Deleted

## 2018-08-26 ENCOUNTER — Encounter: Payer: Self-pay | Admitting: Emergency Medicine

## 2018-08-26 LAB — COMPREHENSIVE METABOLIC PANEL
ALT: 13 IU/L (ref 0–32)
AST: 16 IU/L (ref 0–40)
Albumin/Globulin Ratio: 1.4 (ref 1.2–2.2)
Albumin: 4 g/dL (ref 3.8–4.8)
Alkaline Phosphatase: 76 IU/L (ref 39–117)
BUN/Creatinine Ratio: 18 (ref 9–23)
BUN: 7 mg/dL (ref 6–24)
Bilirubin Total: <0.2 mg/dL (ref 0.0–1.2)
CO2: 21 mmol/L (ref 20–29)
Calcium: 9.8 mg/dL (ref 8.7–10.2)
Chloride: 102 mmol/L (ref 96–106)
Creatinine, Ser: 0.4 mg/dL — ABNORMAL LOW (ref 0.57–1.00)
GFR calc Af Amer: 149 mL/min/{1.73_m2} (ref >59)
GFR calc non Af Amer: 129 mL/min/{1.73_m2} (ref >59)
Globulin, Total: 2.9 g/dL (ref 1.5–4.5)
Glucose: 87 mg/dL (ref 65–99)
Potassium: 3.8 mmol/L (ref 3.5–5.2)
Sodium: 136 mmol/L (ref 134–144)
Total Protein: 6.9 g/dL (ref 6.0–8.5)

## 2018-08-26 LAB — TSH: TSH: 2.16 u[IU]/mL (ref 0.450–4.500)

## 2018-08-26 LAB — PROTEIN / CREATININE RATIO, URINE
Creatinine, Urine: 20.7 mg/dL
Protein, Ur: <4 mg/dL

## 2018-08-27 LAB — MATERNIT 21 PLUS CORE, BLOOD
Fetal Fraction: 9
Result (T21): NEGATIVE
Trisomy 13 (Patau syndrome): NEGATIVE
Trisomy 18 (Edwards syndrome): NEGATIVE
Trisomy 21 (Down syndrome): NEGATIVE

## 2018-08-30 ENCOUNTER — Telehealth (HOSPITAL_COMMUNITY): Payer: Self-pay | Admitting: *Deleted

## 2018-08-30 NOTE — Telephone Encounter (Signed)
Called patient with [facility] spanish interpreter.  Negative maternity 21 results given.  Pt glad to hear the good results.

## 2018-09-20 ENCOUNTER — Telehealth: Payer: Self-pay | Admitting: Family Medicine

## 2018-09-20 NOTE — Telephone Encounter (Signed)
Patient informed about appt with EDA, patient have [employer] webex meetings app downloaded

## 2018-09-21 ENCOUNTER — Telehealth: Payer: Self-pay | Admitting: Obstetrics & Gynecology

## 2018-09-21 NOTE — Telephone Encounter (Signed)
Called patient with [employer] help. She already has WebEx app.

## 2018-09-22 ENCOUNTER — Other Ambulatory Visit: Payer: Self-pay

## 2018-09-22 ENCOUNTER — Ambulatory Visit (INDEPENDENT_AMBULATORY_CARE_PROVIDER_SITE_OTHER): Payer: Self-pay | Admitting: Obstetrics & Gynecology

## 2018-09-22 VITALS — BP 130/86 | HR 94

## 2018-09-22 DIAGNOSIS — O99282 Endocrine, nutritional and metabolic diseases complicating pregnancy, second trimester: Secondary | ICD-10-CM

## 2018-09-22 DIAGNOSIS — O0992 Supervision of high risk pregnancy, unspecified, second trimester: Secondary | ICD-10-CM

## 2018-09-22 DIAGNOSIS — E039 Hypothyroidism, unspecified: Secondary | ICD-10-CM

## 2018-09-22 DIAGNOSIS — Z3A17 17 weeks gestation of pregnancy: Secondary | ICD-10-CM

## 2018-09-22 DIAGNOSIS — O34219 Maternal care for unspecified type scar from previous cesarean delivery: Secondary | ICD-10-CM

## 2018-09-22 DIAGNOSIS — O10912 Unspecified pre-existing hypertension complicating pregnancy, second trimester: Secondary | ICD-10-CM

## 2018-09-22 DIAGNOSIS — O0990 Supervision of high risk pregnancy, unspecified, unspecified trimester: Secondary | ICD-10-CM

## 2018-09-22 DIAGNOSIS — O09522 Supervision of elderly multigravida, second trimester: Secondary | ICD-10-CM

## 2018-09-22 DIAGNOSIS — O10919 Unspecified pre-existing hypertension complicating pregnancy, unspecified trimester: Secondary | ICD-10-CM

## 2018-09-22 NOTE — Progress Notes (Signed)
   [place] VIRTUAL VIDEO VISIT ENCOUNTER NOTE  Provider location: Center for [facility] at [facility]   I connected with Malyna Budney on 09/22/18 at 10:15 AM EDT by WebEx Encounter at home and verified that I am speaking with the correct person using two identifiers.   I discussed the limitations, risks, security and privacy concerns of performing an evaluation and management service by telephone and the availability of in person appointments. I also discussed with the patient that there may be a patient responsible charge related to this service. The patient expressed understanding and agreed to proceed. Subjective:  Sharon Brennan is a 43 y.o. [id number] (66, 38, 34 yo kids) at [redacted]w[redacted]d being seen today for ongoing prenatal care.  She is currently monitored for the following issues for this high-risk pregnancy and has LIVER FUNCTION TESTS, ABNORMAL, HX OF; Hypertension; Hypothyroidism affecting pregnancy; GERD (gastroesophageal reflux disease); Previous cesarean section complicating pregnancy; Supervision of high risk pregnancy, antepartum; Chronic hypertension during pregnancy, antepartum; and AMA (advanced maternal age) multigravida 35+ on their problem list.  Patient reports no complaints.  Contractions: Not present. Vag. Bleeding: None.  Movement: Present. Denies any leaking of fluid.   The following portions of the patient's history were reviewed and updated as appropriate: allergies, current medications, past family history, past medical history, past social history, past surgical history and problem list.   Objective:   Vitals:   09/22/18 1005  BP: 130/86  Pulse: 94    Fetal Status:     Movement: Present     General:  Alert, oriented and cooperative. Patient is in no acute distress.  Respiratory: Normal respiratory effort, no problems with respiration noted  Mental Status: Normal mood and affect. Normal behavior. Normal judgment and thought  content.  Rest of physical exam deferred due to type of encounter  Imaging: No results found.  Assessment and Plan:  Pregnancy: [id number] at [redacted]w[redacted]d 1. Multigravida of advanced maternal age in second trimester - low risk NIPS  2. Previous cesarean section complicating pregnancy - wants another VBAC  3. Supervision of high risk pregnancy, antepartum - MFM u/s scheduled  4. Hypothyroidism affecting pregnancy in second trimester - normal TSH recent  5. Chronic hypertension during pregnancy, antepartum - baby asa daily - She has a BP cuff at home, knows to call us if BP is above 140/90  Preterm labor symptoms and general obstetric precautions including but not limited to vaginal bleeding, contractions, leaking of fluid and fetal movement were reviewed in detail with the patient. I discussed the assessment and treatment plan with the patient. The patient was provided an opportunity to ask questions and all were answered. The patient agreed with the plan and demonstrated an understanding of the instructions. The patient was advised to call back or seek an in-person office evaluation/go to MAU at [facility] for any urgent or concerning symptoms. Please refer to After Visit Summary for other counseling recommendations.   I provided 10 minutes of face-to-face time during this encounter.  Return in about 4 weeks (around 10/20/2018) for virtual.  Future Appointments  Date Time Provider [place]  10/04/2018  1:00 PM [place] MFC-US  10/04/2018  1:00 PM WH-MFC [place] 3 WH-MFCUS MFC-US    Evangelina Delancey C Brissa Asante, [place] for [employer], [place]

## 2018-09-22 NOTE — Progress Notes (Signed)
I connected with  Sharon Brennan on 09/22/18 at 10:15 AM EDT by telephone and verified that I am speaking with the correct person using two identifiers.   I discussed the limitations, risks, security and privacy concerns of performing an evaluation and management service by telephone and the availability of in person appointments. I also discussed with the patient that there may be a patient responsible charge related to this service. The patient expressed understanding and agreed to proceed.  Dolores Hoose, RN 09/22/2018  10:01 AM  Pt has a BP cuff and checks regularly and writes down.

## 2018-09-28 MED FILL — LEVOTHYROXINE 50 MCG TABLET: 50 | 30 days supply | Qty: 30 | Fill #3

## 2018-10-04 ENCOUNTER — Ambulatory Visit (HOSPITAL_COMMUNITY)
Admission: RE | Admit: 2018-10-04 | Discharge: 2018-10-04 | Disposition: A | Discharge status: Home / Self Care | Payer: Self-pay | Source: Ambulatory Visit | Attending: Obstetrics and Gynecology | Admitting: Obstetrics and Gynecology

## 2018-10-04 ENCOUNTER — Encounter (HOSPITAL_COMMUNITY): Payer: Self-pay | Admitting: *Deleted

## 2018-10-04 ENCOUNTER — Other Ambulatory Visit: Payer: Self-pay

## 2018-10-04 ENCOUNTER — Ambulatory Visit (HOSPITAL_COMMUNITY): Payer: Self-pay | Admitting: *Deleted

## 2018-10-04 DIAGNOSIS — Z3A19 19 weeks gestation of pregnancy: Secondary | ICD-10-CM

## 2018-10-04 DIAGNOSIS — O10919 Unspecified pre-existing hypertension complicating pregnancy, unspecified trimester: Secondary | ICD-10-CM

## 2018-10-04 DIAGNOSIS — E039 Hypothyroidism, unspecified: Secondary | ICD-10-CM

## 2018-10-04 DIAGNOSIS — O34219 Maternal care for unspecified type scar from previous cesarean delivery: Secondary | ICD-10-CM

## 2018-10-04 DIAGNOSIS — O4402 Placenta previa specified as without hemorrhage, second trimester: Secondary | ICD-10-CM

## 2018-10-04 DIAGNOSIS — O09522 Supervision of elderly multigravida, second trimester: Secondary | ICD-10-CM | POA: Insufficient documentation

## 2018-10-04 DIAGNOSIS — O0990 Supervision of high risk pregnancy, unspecified, unspecified trimester: Secondary | ICD-10-CM

## 2018-10-04 DIAGNOSIS — Z363 Encounter for antenatal screening for malformations: Secondary | ICD-10-CM

## 2018-10-04 DIAGNOSIS — O10012 Pre-existing essential hypertension complicating pregnancy, second trimester: Secondary | ICD-10-CM

## 2018-10-04 DIAGNOSIS — O99282 Endocrine, nutritional and metabolic diseases complicating pregnancy, second trimester: Secondary | ICD-10-CM

## 2018-10-05 ENCOUNTER — Other Ambulatory Visit (HOSPITAL_COMMUNITY): Payer: Self-pay | Admitting: *Deleted

## 2018-10-05 DIAGNOSIS — O10919 Unspecified pre-existing hypertension complicating pregnancy, unspecified trimester: Secondary | ICD-10-CM

## 2018-10-21 ENCOUNTER — Other Ambulatory Visit: Payer: Self-pay

## 2018-10-21 ENCOUNTER — Ambulatory Visit (INDEPENDENT_AMBULATORY_CARE_PROVIDER_SITE_OTHER): Payer: Self-pay | Admitting: Family Medicine

## 2018-10-21 VITALS — BP 123/79 | HR 83

## 2018-10-21 DIAGNOSIS — O0990 Supervision of high risk pregnancy, unspecified, unspecified trimester: Secondary | ICD-10-CM

## 2018-10-21 DIAGNOSIS — O10912 Unspecified pre-existing hypertension complicating pregnancy, second trimester: Secondary | ICD-10-CM

## 2018-10-21 DIAGNOSIS — Z3A21 21 weeks gestation of pregnancy: Secondary | ICD-10-CM

## 2018-10-21 DIAGNOSIS — O99282 Endocrine, nutritional and metabolic diseases complicating pregnancy, second trimester: Secondary | ICD-10-CM

## 2018-10-21 DIAGNOSIS — O34219 Maternal care for unspecified type scar from previous cesarean delivery: Secondary | ICD-10-CM

## 2018-10-21 DIAGNOSIS — O0992 Supervision of high risk pregnancy, unspecified, second trimester: Secondary | ICD-10-CM

## 2018-10-21 DIAGNOSIS — O09522 Supervision of elderly multigravida, second trimester: Secondary | ICD-10-CM

## 2018-10-21 DIAGNOSIS — O10919 Unspecified pre-existing hypertension complicating pregnancy, unspecified trimester: Secondary | ICD-10-CM

## 2018-10-21 DIAGNOSIS — E039 Hypothyroidism, unspecified: Secondary | ICD-10-CM

## 2018-10-21 NOTE — Progress Notes (Signed)
   TELEHEALTH VIRTUAL OBSTETRICS VISIT ENCOUNTER NOTE  I connected with Sharon Brennan on 10/21/18 at 10:55 AM EDT by telephone at home and verified that I am speaking with the correct person using two identifiers.   I discussed the limitations, risks, security and privacy concerns of performing an evaluation and management service by telephone and the availability of in person appointments. I also discussed with the patient that there may be a patient responsible charge related to this service. The patient expressed understanding and agreed to proceed.  Subjective:  Sharon Brennan is a 43 y.o. [id number] at [redacted]w[redacted]d being followed for ongoing prenatal care.  She is currently monitored for the following issues for this high-risk pregnancy and has LIVER FUNCTION TESTS, ABNORMAL, HX OF; Hypertension; Hypothyroidism affecting pregnancy; GERD (gastroesophageal reflux disease); Previous cesarean section complicating pregnancy; Supervision of high risk pregnancy, antepartum; Chronic hypertension during pregnancy, antepartum; and AMA (advanced maternal age) multigravida 35+ on their problem list.  Patient reports had two days of intermittent umbilical pain, worse with walking, but now better.. Reports fetal movement. Denies any contractions, bleeding or leaking of fluid.   The following portions of the patient's history were reviewed and updated as appropriate: allergies, current medications, past family history, past medical history, past social history, past surgical history and problem list.   Objective:   General:  Alert, oriented and cooperative.   Mental Status: Normal mood and affect perceived. Normal judgment and thought content.  Rest of physical exam deferred due to type of encounter  Assessment and Plan:  Pregnancy: [id number] at [redacted]w[redacted]d 1. Supervision of high risk pregnancy, antepartum Fetal movement normal  2. Previous cesarean section complicating pregnancy TOLAC  3. Hypothyroidism  affecting pregnancy in second trimester Check TSH at 28 week labs  4. Chronic hypertension during pregnancy, antepartum Taking ASA 81mg  Daily.  5. AMA Low risk panorama ASA 81mg  Growth [place]  Preterm labor symptoms and general obstetric precautions including but not limited to vaginal bleeding, contractions, leaking of fluid and fetal movement were reviewed in detail with the patient.  I discussed the assessment and treatment plan with the patient. The patient was provided an opportunity to ask questions and all were answered. The patient agreed with the plan and demonstrated an understanding of the instructions. The patient was advised to call back or seek an in-person office evaluation/go to MAU at [facility] for any urgent or concerning symptoms. Please refer to After Visit Summary for other counseling recommendations.   I provided 13 minutes of non-face-to-face time during this encounter.  Return in about 4 weeks (around 11/18/2018) for HR OB f/u, 2 hr GTT, In Office.  Future Appointments  Date Time Provider [place]  11/01/2018  9:30 AM [place] MFC-US  11/01/2018  9:30 AM WH-MFC [place] 1 WH-MFCUS MFC-US    Lillard Bailon J Saray Capasso, DO Center for [employer], [place]

## 2018-10-21 NOTE — Progress Notes (Signed)
I connected with  Sharon Brennan on 10/21/18 at 10:55 AM EDT by telephone and verified that I am speaking with the correct person using two identifiers.   I discussed the limitations, risks, security and privacy concerns of performing an evaluation and management service by telephone and the availability of in person appointments. I also discussed with the patient that there may be a patient responsible charge related to this service. The patient expressed understanding and agreed to proceed. She has taken bp at home since last visit : 10/05/18 120/74, P 87; 10/11/18 117/75, P 86 Linda,RN C/o pain in belly button /abdomen since Friday 10/21/2018  10:46 AM

## 2018-10-24 ENCOUNTER — Telehealth: Payer: Self-pay | Admitting: Family Medicine

## 2018-10-24 DIAGNOSIS — E038 Other specified hypothyroidism: Secondary | ICD-10-CM

## 2018-10-24 NOTE — Telephone Encounter (Signed)
1) Medication(s) Requested (by name): levothyroxine (SYNTHROID, LEVOTHROID) 50 MCG tablet [[id number]]    2) Pharmacy of Choice: [place] [place]  3) Special Requests:   Approved medications will be sent to the pharmacy, we will reach out if there is an issue.  Requests made after 3pm may not be addressed until the following business day!  If a patient is unsure of the name of the medication(s) please note and ask patient to call back when they are able to provide all info, do not send to responsible party until all information is available!

## 2018-10-25 MED ORDER — LEVOTHYROXINE SODIUM 50 MCG PO TABS: 50.0000 ug | ORAL_TABLET | Freq: Every day | ORAL | 2 refills | Status: DC

## 2018-10-25 MED FILL — LEVOTHYROXINE 50 MCG TABLET: 50 | 30 days supply | Qty: 30 | Fill #0

## 2018-10-31 ENCOUNTER — Telehealth: Payer: Self-pay | Admitting: Family Medicine

## 2018-10-31 NOTE — Telephone Encounter (Signed)
Patient called wanting to know if her documents were received. Please follow up.

## 2018-10-31 NOTE — Telephone Encounter (Signed)
Pt is confirmed the papers

## 2018-11-01 ENCOUNTER — Encounter (HOSPITAL_COMMUNITY): Payer: Self-pay

## 2018-11-01 ENCOUNTER — Other Ambulatory Visit: Payer: Self-pay

## 2018-11-01 ENCOUNTER — Ambulatory Visit (HOSPITAL_COMMUNITY)
Admission: RE | Admit: 2018-11-01 | Discharge: 2018-11-01 | Disposition: A | Discharge status: Home / Self Care | Payer: Self-pay | Source: Ambulatory Visit | Attending: Obstetrics and Gynecology | Admitting: Obstetrics and Gynecology

## 2018-11-01 ENCOUNTER — Ambulatory Visit (HOSPITAL_COMMUNITY): Payer: Self-pay | Admitting: *Deleted

## 2018-11-01 VITALS — BP 130/69 | HR 91 | Temp 98.7°F

## 2018-11-01 DIAGNOSIS — O09523 Supervision of elderly multigravida, third trimester: Secondary | ICD-10-CM | POA: Insufficient documentation

## 2018-11-01 DIAGNOSIS — Z3A23 23 weeks gestation of pregnancy: Secondary | ICD-10-CM

## 2018-11-01 DIAGNOSIS — O4403 Placenta previa specified as without hemorrhage, third trimester: Secondary | ICD-10-CM

## 2018-11-01 DIAGNOSIS — O34219 Maternal care for unspecified type scar from previous cesarean delivery: Secondary | ICD-10-CM

## 2018-11-01 DIAGNOSIS — O10919 Unspecified pre-existing hypertension complicating pregnancy, unspecified trimester: Secondary | ICD-10-CM | POA: Insufficient documentation

## 2018-11-01 DIAGNOSIS — E039 Hypothyroidism, unspecified: Secondary | ICD-10-CM

## 2018-11-01 DIAGNOSIS — Z362 Encounter for other antenatal screening follow-up: Secondary | ICD-10-CM

## 2018-11-01 DIAGNOSIS — O99283 Endocrine, nutritional and metabolic diseases complicating pregnancy, third trimester: Secondary | ICD-10-CM

## 2018-11-01 DIAGNOSIS — O10013 Pre-existing essential hypertension complicating pregnancy, third trimester: Secondary | ICD-10-CM

## 2018-11-02 ENCOUNTER — Telehealth: Payer: Self-pay | Admitting: Family Medicine

## 2018-11-02 ENCOUNTER — Ambulatory Visit: Payer: Self-pay | Attending: Family Medicine

## 2018-11-02 ENCOUNTER — Other Ambulatory Visit (HOSPITAL_COMMUNITY): Payer: Self-pay | Admitting: *Deleted

## 2018-11-02 DIAGNOSIS — O10912 Unspecified pre-existing hypertension complicating pregnancy, second trimester: Secondary | ICD-10-CM

## 2018-11-02 NOTE — Telephone Encounter (Signed)
I called Pt since she has a TELE visit with financial, no answer and VM is NOT set up unable to LVM, 9:00am

## 2018-11-17 ENCOUNTER — Telehealth: Payer: Self-pay | Admitting: Obstetrics & Gynecology

## 2018-11-17 NOTE — Telephone Encounter (Signed)
Called with the in-office interrupter Raquel. Informed the patient of the upcoming visit as well as our office location. Informed the patient of no children or visitors due to the COVID19 restrictions. The patient also answered no to covid19 questionnaire.  °

## 2018-11-18 ENCOUNTER — Other Ambulatory Visit: Payer: Self-pay

## 2018-11-18 ENCOUNTER — Ambulatory Visit (INDEPENDENT_AMBULATORY_CARE_PROVIDER_SITE_OTHER): Payer: Self-pay | Admitting: Family Medicine

## 2018-11-18 VITALS — BP 149/89 | HR 101 | Wt 162.5 lb

## 2018-11-18 DIAGNOSIS — E039 Hypothyroidism, unspecified: Secondary | ICD-10-CM

## 2018-11-18 DIAGNOSIS — O99282 Endocrine, nutritional and metabolic diseases complicating pregnancy, second trimester: Secondary | ICD-10-CM

## 2018-11-18 DIAGNOSIS — Z3A25 25 weeks gestation of pregnancy: Secondary | ICD-10-CM

## 2018-11-18 DIAGNOSIS — O34219 Maternal care for unspecified type scar from previous cesarean delivery: Secondary | ICD-10-CM

## 2018-11-18 DIAGNOSIS — O0990 Supervision of high risk pregnancy, unspecified, unspecified trimester: Secondary | ICD-10-CM

## 2018-11-18 DIAGNOSIS — O0992 Supervision of high risk pregnancy, unspecified, second trimester: Secondary | ICD-10-CM

## 2018-11-18 DIAGNOSIS — O10919 Unspecified pre-existing hypertension complicating pregnancy, unspecified trimester: Secondary | ICD-10-CM

## 2018-11-18 DIAGNOSIS — O09522 Supervision of elderly multigravida, second trimester: Secondary | ICD-10-CM

## 2018-11-18 DIAGNOSIS — O10912 Unspecified pre-existing hypertension complicating pregnancy, second trimester: Secondary | ICD-10-CM

## 2018-11-18 NOTE — Progress Notes (Signed)
   PRENATAL VISIT NOTE  Subjective:  Sharon Brennan is a 43 y.o. [id number] at [redacted]w[redacted]d being seen today for ongoing prenatal care.  She is currently monitored for the following issues for this high-risk pregnancy and has LIVER FUNCTION TESTS, ABNORMAL, HX OF; Hypertension; Hypothyroidism affecting pregnancy; GERD (gastroesophageal reflux disease); Previous cesarean section complicating pregnancy; Supervision of high risk pregnancy, antepartum; Chronic hypertension during pregnancy, antepartum; and AMA (advanced maternal age) multigravida 35+ on their problem list.  Patient reports no complaints.  Contractions: Not present.  .  Movement: Present. Denies leaking of fluid.   The following portions of the patient's history were reviewed and updated as appropriate: allergies, current medications, past family history, past medical history, past social history, past surgical history and problem list.   Objective:   Vitals:   11/18/18 1004 11/18/18 1005  BP: (!) 151/95 (!) 149/89  Pulse: (!) 101   Weight: 162 lb 8 oz (73.7 kg)     Fetal Status: Fetal Heart Rate (bpm): 172   Movement: Present     General:  Alert, oriented and cooperative. Patient is in no acute distress.  Skin: Skin is warm and dry. No rash noted.   Cardiovascular: Normal heart rate noted  Respiratory: Normal respiratory effort, no problems with respiration noted  Abdomen: Soft, gravid, appropriate for gestational age.  Pain/Pressure: Absent     Pelvic: Cervical exam deferred        Extremities: Normal range of motion.     Mental Status: Normal mood and affect. Normal behavior. Normal judgment and thought content.   Assessment and Plan:  Pregnancy: [id number] at [redacted]w[redacted]d 1. Chronic hypertension during pregnancy, antepartum BP is well controlled on no meds Continue ASA Growth WNL (53%) 11/01/2018--f/u scheduled   2. Previous cesarean section complicating pregnancy For TOLAC--needs consent next visit  3. Supervision of high  risk pregnancy, antepartum 28 wk labs next visit - CBC; Future - HIV Antibody (routine testing w rflx); Future - Glucose Tolerance, 2 Hours w/1 Hour; Future - RPR; Future  4. Multigravida of advanced maternal age in second trimester nml NIPT  5. Hypothyroidism affecting pregnancy in second trimester Continue Synthroid Check TSH with 28 wk labs - TSH; Future Preterm labor symptoms and general obstetric precautions including but not limited to vaginal bleeding, contractions, leaking of fluid and fetal movement were reviewed in detail with the patient. Please refer to After Visit Summary for other counseling recommendations.   Return in about 3 weeks (around 12/09/2018) for in person, HRC, 28 wk labs WITH 2 HOUR.  Future Appointments  Date Time Provider [place]  11/29/2018  9:45 AM [place] MFC-US  11/29/2018  9:45 AM WH-MFC [place] 2 WH-MFCUS MFC-US  12/06/2018  1:50 PM [name], MD CHW-CHWW None  12/13/2018  8:20 AM WOC-WOCA LAB WOC-WOCA WOC  12/13/2018  9:15 AM Aletha Halim, MD [facility] WOC    Donnamae Jude, MD

## 2018-11-29 ENCOUNTER — Encounter (HOSPITAL_COMMUNITY): Payer: Self-pay

## 2018-11-29 ENCOUNTER — Ambulatory Visit (HOSPITAL_COMMUNITY)
Admission: RE | Admit: 2018-11-29 | Discharge: 2018-11-29 | Disposition: A | Discharge status: Home / Self Care | Payer: Self-pay | Source: Ambulatory Visit | Attending: Obstetrics and Gynecology | Admitting: Obstetrics and Gynecology

## 2018-11-29 ENCOUNTER — Ambulatory Visit (HOSPITAL_COMMUNITY): Payer: Self-pay | Admitting: *Deleted

## 2018-11-29 ENCOUNTER — Other Ambulatory Visit (HOSPITAL_COMMUNITY): Payer: Self-pay | Admitting: *Deleted

## 2018-11-29 ENCOUNTER — Other Ambulatory Visit: Payer: Self-pay

## 2018-11-29 DIAGNOSIS — O0990 Supervision of high risk pregnancy, unspecified, unspecified trimester: Secondary | ICD-10-CM | POA: Insufficient documentation

## 2018-11-29 DIAGNOSIS — O09522 Supervision of elderly multigravida, second trimester: Secondary | ICD-10-CM

## 2018-11-29 DIAGNOSIS — Z3A27 27 weeks gestation of pregnancy: Secondary | ICD-10-CM

## 2018-11-29 DIAGNOSIS — O10919 Unspecified pre-existing hypertension complicating pregnancy, unspecified trimester: Secondary | ICD-10-CM

## 2018-11-29 DIAGNOSIS — O34219 Maternal care for unspecified type scar from previous cesarean delivery: Secondary | ICD-10-CM

## 2018-11-29 DIAGNOSIS — O99282 Endocrine, nutritional and metabolic diseases complicating pregnancy, second trimester: Secondary | ICD-10-CM

## 2018-11-29 DIAGNOSIS — O10913 Unspecified pre-existing hypertension complicating pregnancy, third trimester: Secondary | ICD-10-CM

## 2018-11-29 DIAGNOSIS — O10912 Unspecified pre-existing hypertension complicating pregnancy, second trimester: Secondary | ICD-10-CM | POA: Insufficient documentation

## 2018-11-29 DIAGNOSIS — E039 Hypothyroidism, unspecified: Secondary | ICD-10-CM

## 2018-11-29 DIAGNOSIS — Z362 Encounter for other antenatal screening follow-up: Secondary | ICD-10-CM

## 2018-11-29 DIAGNOSIS — O10012 Pre-existing essential hypertension complicating pregnancy, second trimester: Secondary | ICD-10-CM

## 2018-11-30 MED FILL — LEVOTHYROXINE 50 MCG TABLET: 50 | 30 days supply | Qty: 30 | Fill #1

## 2018-12-06 ENCOUNTER — Ambulatory Visit: Payer: Self-pay | Admitting: Family Medicine

## 2018-12-12 ENCOUNTER — Ambulatory Visit: Payer: Self-pay | Attending: Family Medicine | Admitting: Family Medicine

## 2018-12-12 ENCOUNTER — Other Ambulatory Visit: Payer: Self-pay

## 2018-12-12 ENCOUNTER — Encounter: Payer: Self-pay | Admitting: Family Medicine

## 2018-12-12 ENCOUNTER — Telehealth: Payer: Self-pay | Admitting: Obstetrics and Gynecology

## 2018-12-12 DIAGNOSIS — O99282 Endocrine, nutritional and metabolic diseases complicating pregnancy, second trimester: Secondary | ICD-10-CM

## 2018-12-12 DIAGNOSIS — E039 Hypothyroidism, unspecified: Secondary | ICD-10-CM

## 2018-12-12 DIAGNOSIS — O10012 Pre-existing essential hypertension complicating pregnancy, second trimester: Secondary | ICD-10-CM

## 2018-12-12 NOTE — Progress Notes (Signed)
Patient has been called and DOB has been verified. Patient has been screened and transferred to PCP to start phone visit.     

## 2018-12-12 NOTE — Progress Notes (Signed)
Virtual Visit via Telephone Note  I connected with Sharon Brennan, on 12/12/2018 at 3:07 PM by telephone due to the COVID-19 pandemic and verified that I am speaking with the correct person using two identifiers.   Consent: I discussed the limitations, risks, security and privacy concerns of performing an evaluation and management service by telephone and the availability of in person appointments. I also discussed with the patient that there may be a patient responsible charge related to this service. The patient expressed understanding and agreed to proceed.   Location of Patient: Home  Location of Provider: Clinic   Persons participating in Telemedicine visit: Shauntel Prest [place]-CMA Dr. Benson Setting - daughter    History of Present Illness: 43 year old female at [redacted] weeks gestational age currently followed by OB/GYN with a history of hypertension, hypothyroidism who presents today for follow-up visit. She endorses compliance with levothyroxine and is currently not on any antihypertensive except diet control.  She is also on aspirin for preeclampsia prophylaxis. She is scheduled this appointment to share the news of her pregnancy given this was a surprise as she had been unsuccessful in conceiving for several years after her 19-year-old daughter. Last visit with GYN was in 11/18/18  Past Medical History:  Diagnosis Date  . Hypertension    Allergies  Allergen Reactions  . Pollen Extract     Current Outpatient Medications on File Prior to Visit  Medication Sig Dispense Refill  . aspirin EC 81 MG tablet Take 1 tablet (81 mg total) by mouth daily. Take after 12 weeks for prevention of preeclampsia later in pregnancy 300 tablet 2  . cetirizine (ZYRTEC ALLERGY) 10 MG tablet Take 1 tablet (10 mg total) by mouth daily. 30 tablet 3  . levothyroxine (SYNTHROID) 50 MCG tablet Take 1 tablet (50 mcg total) by mouth daily before breakfast. Reported on  04/01/2015 30 tablet 2  . Prenatal Vit-Fe Fumarate-FA (PRENATAL VITAMIN PO) Take by mouth.    . Acetaminophen (TYLENOL) 325 MG CAPS Take 1 tablet by mouth as needed.      No current facility-administered medications on file prior to visit.     Observations/Objective: Alert, awake, oriented x3 Not in acute distress  Assessment and Plan: 1. Hypothyroidism affecting pregnancy in second trimester Advised hypothyroidism will be managed by her OB/GYN Continue levothyroxine as per OB/GYN Advised she does not need an appointment with me until postpartum.  2.  Pre-existing essential hypertension in second trimester Diet control and management as per OB/GYN Aspirin for preeclampsia prophylaxis  Follow Up Instructions: She will call for an appointment postpartum   I discussed the assessment and treatment plan with the patient. The patient was provided an opportunity to ask questions and all were answered. The patient agreed with the plan and demonstrated an understanding of the instructions.   The patient was advised to call back or seek an in-person evaluation if the symptoms worsen or if the condition fails to improve as anticipated.     I provided 15 minutes total of non-face-to-face time during this encounter including median intraservice time, reviewing previous notes, labs, imaging, medications, management and patient verbalized understanding.     [name], MD, FAAFP. [facility] and [place], [place]   12/12/2018, 3:07 PM

## 2018-12-12 NOTE — Telephone Encounter (Signed)
Attempted to call patient w/ Spanish interpreter ID# [phone] about her appointment on 9/1 @ 8:20. No answer and could not leave a voicemail because it was not set-up.

## 2018-12-13 ENCOUNTER — Other Ambulatory Visit: Payer: Self-pay

## 2018-12-13 ENCOUNTER — Other Ambulatory Visit: Payer: Self-pay | Admitting: *Deleted

## 2018-12-13 ENCOUNTER — Ambulatory Visit (INDEPENDENT_AMBULATORY_CARE_PROVIDER_SITE_OTHER): Payer: Self-pay | Admitting: Obstetrics and Gynecology

## 2018-12-13 VITALS — BP 132/84 | HR 89 | Wt 164.9 lb

## 2018-12-13 DIAGNOSIS — Z98891 History of uterine scar from previous surgery: Secondary | ICD-10-CM

## 2018-12-13 DIAGNOSIS — O10919 Unspecified pre-existing hypertension complicating pregnancy, unspecified trimester: Secondary | ICD-10-CM

## 2018-12-13 DIAGNOSIS — O34219 Maternal care for unspecified type scar from previous cesarean delivery: Secondary | ICD-10-CM

## 2018-12-13 DIAGNOSIS — E038 Other specified hypothyroidism: Secondary | ICD-10-CM

## 2018-12-13 DIAGNOSIS — O0993 Supervision of high risk pregnancy, unspecified, third trimester: Secondary | ICD-10-CM

## 2018-12-13 DIAGNOSIS — O0990 Supervision of high risk pregnancy, unspecified, unspecified trimester: Secondary | ICD-10-CM

## 2018-12-13 DIAGNOSIS — O10913 Unspecified pre-existing hypertension complicating pregnancy, third trimester: Secondary | ICD-10-CM

## 2018-12-13 DIAGNOSIS — O09523 Supervision of elderly multigravida, third trimester: Secondary | ICD-10-CM

## 2018-12-13 DIAGNOSIS — O99283 Endocrine, nutritional and metabolic diseases complicating pregnancy, third trimester: Secondary | ICD-10-CM

## 2018-12-13 DIAGNOSIS — Z3A29 29 weeks gestation of pregnancy: Secondary | ICD-10-CM

## 2018-12-13 DIAGNOSIS — Z789 Other specified health status: Secondary | ICD-10-CM

## 2018-12-13 DIAGNOSIS — E039 Hypothyroidism, unspecified: Secondary | ICD-10-CM

## 2018-12-13 NOTE — Progress Notes (Signed)
Spanish Interpreter Eda R. 

## 2018-12-13 NOTE — Progress Notes (Signed)
Prenatal Visit Note Date: 12/13/2018 Clinic: [facility]  Subjective:  Sharon Brennan is a 43 y.o. [id number] at [redacted]w[redacted]d being seen today for ongoing prenatal care.  She is currently monitored for the following issues for this high-risk pregnancy and has LIVER FUNCTION TESTS, ABNORMAL, HX OF; Hypertension; Hypothyroidism affecting pregnancy; GERD (gastroesophageal reflux disease); Previous cesarean section complicating pregnancy; Supervision of high risk pregnancy, antepartum; Chronic hypertension during pregnancy, antepartum; and AMA (advanced maternal age) multigravida 35+ on their problem list.  Patient reports no complaints.   Contractions: Irritability. Vag. Bleeding: None.  Movement: Present. Denies leaking of fluid.   The following portions of the patient's history were reviewed and updated as appropriate: allergies, current medications, past family history, past medical history, past social history, past surgical history and problem list. Problem list updated.  Objective:   Vitals:   12/13/18 0932  BP: 132/84  Pulse: 89  Weight: 164 lb 14.4 oz (74.8 kg)    Fetal Status: Fetal Heart Rate (bpm): 154   Movement: Present     General:  Alert, oriented and cooperative. Patient is in no acute distress.  Skin: Skin is warm and dry. No rash noted.   Cardiovascular: Normal heart rate noted  Respiratory: Normal respiratory effort, no problems with respiration noted  Abdomen: Soft, gravid, appropriate for gestational age. Pain/Pressure: Present     Pelvic:  Cervical exam deferred        Extremities: Normal range of motion.  Edema: None  Mental Status: Normal mood and affect. Normal behavior. Normal judgment and thought content.   Urinalysis:      Assessment and Plan:  Pregnancy: [id number] at [redacted]w[redacted]d  1. Hypothyroidism affecting pregnancy, antepartum On synthroid 50 qday. surveilance tsh today - TSH  2. Chronic hypertension during pregnancy, antepartum Doing well  just on low dose asa. Has surveillance growth u/s. Start bpps at 32wks qwk and delivery at 39wks  3. Previous cesarean section complicating pregnancy H/o VBAC. C/s for transverse per pt report. Desires tolac. Can sign form nv  4. Supervision of high risk pregnancy, antepartum 28wk labs today Desires btl but has no insurance. D/w her that I don't recommend doing a c/s only to get a btl and recommend tolac if she is okay with standard tolac risks. She is amenable to tolac. Sign form nv. Will try and find out price for ppBTL after vag delivery.   65. Multigravida of advanced maternal age in third trimester See above   6. Language barrier Interpreter used  Preterm labor symptoms and general obstetric precautions including but not limited to vaginal bleeding, contractions, leaking of fluid and fetal movement were reviewed in detail with the patient. Please refer to After Visit Summary for other counseling recommendations.  Return in about 2 weeks (around 12/27/2018).   Aletha Halim, MD

## 2018-12-14 ENCOUNTER — Encounter: Payer: Self-pay | Admitting: Obstetrics and Gynecology

## 2018-12-14 ENCOUNTER — Telehealth: Payer: Self-pay | Admitting: General Practice

## 2018-12-14 DIAGNOSIS — O24419 Gestational diabetes mellitus in pregnancy, unspecified control: Secondary | ICD-10-CM | POA: Insufficient documentation

## 2018-12-14 LAB — CBC
Hematocrit: 35.9 % (ref 34.0–46.6)
Hemoglobin: 12.1 g/dL (ref 11.1–15.9)
MCH: 30.5 pg (ref 26.6–33.0)
MCHC: 33.7 g/dL (ref 31.5–35.7)
MCV: 90 fL (ref 79–97)
Platelets: 239 10*3/uL (ref 150–450)
RBC: 3.97 x10E6/uL (ref 3.77–5.28)
RDW: 13 % (ref 11.7–15.4)
WBC: 8 10*3/uL (ref 3.4–10.8)

## 2018-12-14 LAB — RPR: RPR Ser Ql: NONREACTIVE

## 2018-12-14 LAB — GLUCOSE TOLERANCE, 2 HOURS W/ 1HR
Glucose, 1 hour: 200 mg/dL — ABNORMAL HIGH (ref 65–179)
Glucose, 2 hour: 141 mg/dL (ref 65–152)
Glucose, Fasting: 85 mg/dL (ref 65–91)

## 2018-12-14 LAB — TSH: TSH: 3.29 u[IU]/mL (ref 0.450–4.500)

## 2018-12-14 LAB — HIV ANTIBODY (ROUTINE TESTING W REFLEX): HIV Screen 4th Generation wRfx: NONREACTIVE

## 2018-12-14 MED ORDER — LEVOTHYROXINE SODIUM 50 MCG PO TABS: 50.0000 ug | ORAL_TABLET | Freq: Every day | ORAL | 1 refills | Status: DC

## 2018-12-14 NOTE — Telephone Encounter (Signed)
Called patient with Eda for interpreter and informed her of results. Offered diabetes education appt for 9/15 @ 915. Patient verbalized understanding & states she will be here then. Patient had no questions.

## 2018-12-14 NOTE — Telephone Encounter (Signed)
-----   Message from Aletha Halim, MD sent at 12/14/2018 10:21 AM EDT ----- Please let her know that she has GDM and get her set up with teaching, supplies, etc. thanks

## 2018-12-14 NOTE — Addendum Note (Signed)
Addended by: Aletha Halim on: 12/14/2018 10:19 AM   Modules accepted: Orders

## 2018-12-15 ENCOUNTER — Encounter: Payer: Self-pay | Admitting: Obstetrics and Gynecology

## 2018-12-15 NOTE — Progress Notes (Signed)
Per Drema Balzarine:  It's about 10K (hospital billing, physician billing and anesthesia), we do not offer patients w/ no insurance a BTL if its a vag delivery

## 2018-12-20 ENCOUNTER — Telehealth: Payer: Self-pay | Admitting: General Practice

## 2018-12-20 NOTE — Telephone Encounter (Signed)
-----   Message from Aletha Halim, MD sent at 12/15/2018  1:39 PM EDT ----- Regarding: can you call her with an interpreter Per Drema Balzarine  It's about 10K (hospital billing, physician billing and anesthesia), we do not offer patients w/ no insurance a BTL if its a vag delivery    thanks

## 2018-12-20 NOTE — Telephone Encounter (Signed)
Called patient with Sharon Brennan for interpreter and discussed Dr Ilda Basset message with her. Patient verbalized understanding and states her sister had one and she had help paying for it. Told her I'm not sure what the situation was but [facility] does not offer it without money up front. Patient hung up the phone.

## 2018-12-27 ENCOUNTER — Ambulatory Visit: Payer: Self-pay | Admitting: *Deleted

## 2018-12-27 ENCOUNTER — Other Ambulatory Visit: Payer: Self-pay

## 2018-12-27 ENCOUNTER — Encounter: Payer: Self-pay | Attending: Obstetrics & Gynecology | Admitting: *Deleted

## 2018-12-27 DIAGNOSIS — O24419 Gestational diabetes mellitus in pregnancy, unspecified control: Secondary | ICD-10-CM | POA: Insufficient documentation

## 2018-12-27 DIAGNOSIS — Z713 Dietary counseling and surveillance: Secondary | ICD-10-CM | POA: Insufficient documentation

## 2018-12-27 DIAGNOSIS — Z3A00 Weeks of gestation of pregnancy not specified: Secondary | ICD-10-CM | POA: Insufficient documentation

## 2018-12-27 NOTE — Progress Notes (Signed)
  Patient was seen on 12/27/2018 for Gestational Diabetes self-management. Patient speaks Spanish, we had live interpretor for this visit. EDD 02/28/2019. Patient states no history of GDM. Diet history obtained. Patient eats good variety of all food groups. Beverages include milk and water.  The following learning objectives were met by the patient :   States the definition of Gestational Diabetes  States why dietary management is important in controlling blood glucose  Describes the effects of carbohydrates on blood glucose levels  Demonstrates ability to create a balanced meal plan  Demonstrates carbohydrate counting   States when to check blood glucose levels  Demonstrates proper blood glucose monitoring techniques  States the effect of stress and exercise on blood glucose levels  States the importance of limiting caffeine and abstaining from alcohol and smoking  Plan:  Aim for 3 Carb Choices per meal (45 grams) +/- 1 either way  Aim for 1-2 Carbs per snack Begin reading food labels for Total Carbohydrate of foods If OK with your MD, consider  increasing your activity level by walking, Arm Chair Exercises or other activity daily as tolerated Begin checking BG before breakfast and 2 hours after first bite of breakfast, lunch and dinner as directed by MD  Bring Log Book/Sheet and meter to every medical appointment   Patient not appropriate for Baby Scripts due to language barrier  Take medication if directed by MD  Blood glucose monitor given: [employer] Code Lot # [id number] Blood glucose reading: 85 mg/dl  Patient instructed to monitor glucose levels: FBS: 60 - 95 mg/dl 2 hour: <120 mg/dl  Patient received the following handouts: Spanish  Nutrition Diabetes and Pregnancy  Carbohydrate Counting List  BG Log Sheet  Patient will be seen for follow-up as needed.

## 2018-12-29 MED FILL — LEVOTHYROXINE 50 MCG TABLET: 50 | 30 days supply | Qty: 30 | Fill #2

## 2019-01-03 ENCOUNTER — Other Ambulatory Visit: Payer: Self-pay

## 2019-01-03 ENCOUNTER — Other Ambulatory Visit (HOSPITAL_COMMUNITY): Payer: Self-pay | Admitting: *Deleted

## 2019-01-03 ENCOUNTER — Ambulatory Visit (HOSPITAL_COMMUNITY): Payer: Self-pay | Admitting: *Deleted

## 2019-01-03 ENCOUNTER — Ambulatory Visit (HOSPITAL_COMMUNITY)
Admission: RE | Admit: 2019-01-03 | Discharge: 2019-01-03 | Disposition: A | Discharge status: Home / Self Care | Payer: Self-pay | Source: Ambulatory Visit | Attending: Obstetrics and Gynecology | Admitting: Obstetrics and Gynecology

## 2019-01-03 ENCOUNTER — Encounter (HOSPITAL_COMMUNITY): Payer: Self-pay

## 2019-01-03 DIAGNOSIS — O09523 Supervision of elderly multigravida, third trimester: Secondary | ICD-10-CM

## 2019-01-03 DIAGNOSIS — O0990 Supervision of high risk pregnancy, unspecified, unspecified trimester: Secondary | ICD-10-CM | POA: Insufficient documentation

## 2019-01-03 DIAGNOSIS — O10913 Unspecified pre-existing hypertension complicating pregnancy, third trimester: Secondary | ICD-10-CM | POA: Insufficient documentation

## 2019-01-03 DIAGNOSIS — O10919 Unspecified pre-existing hypertension complicating pregnancy, unspecified trimester: Secondary | ICD-10-CM

## 2019-01-03 DIAGNOSIS — O99283 Endocrine, nutritional and metabolic diseases complicating pregnancy, third trimester: Secondary | ICD-10-CM

## 2019-01-03 DIAGNOSIS — Z3A32 32 weeks gestation of pregnancy: Secondary | ICD-10-CM

## 2019-01-03 DIAGNOSIS — E039 Hypothyroidism, unspecified: Secondary | ICD-10-CM

## 2019-01-03 DIAGNOSIS — O10013 Pre-existing essential hypertension complicating pregnancy, third trimester: Secondary | ICD-10-CM

## 2019-01-03 DIAGNOSIS — O34219 Maternal care for unspecified type scar from previous cesarean delivery: Secondary | ICD-10-CM

## 2019-01-04 ENCOUNTER — Telehealth: Payer: Self-pay | Admitting: Obstetrics & Gynecology

## 2019-01-04 NOTE — Telephone Encounter (Signed)
Spanish interpreter Eda attempted to contact patient about her appointment on 9/24 @ 10:15. No answer, and Eda was unable to leave a message.

## 2019-01-05 ENCOUNTER — Encounter: Payer: Self-pay | Admitting: Obstetrics & Gynecology

## 2019-01-05 ENCOUNTER — Other Ambulatory Visit: Payer: Self-pay

## 2019-01-05 NOTE — Progress Notes (Signed)
1028 Attempted to contact patient. Call going straight to voicemail and voicemail is not set up to leave a message.   1038 Second attempt to contact patient. Call going to voicemail that has not been set up. Unable to contact pt.

## 2019-01-10 ENCOUNTER — Telehealth: Payer: Self-pay | Admitting: Obstetrics and Gynecology

## 2019-01-10 NOTE — Telephone Encounter (Signed)
Spanish interpreter Eda attempted to contact patient about her appointment on 9/30 @ 10:15. No answer, and Eda was unable to leave a message.

## 2019-01-11 ENCOUNTER — Other Ambulatory Visit: Payer: Self-pay

## 2019-01-11 ENCOUNTER — Ambulatory Visit (INDEPENDENT_AMBULATORY_CARE_PROVIDER_SITE_OTHER): Payer: Self-pay | Admitting: *Deleted

## 2019-01-11 ENCOUNTER — Ambulatory Visit (INDEPENDENT_AMBULATORY_CARE_PROVIDER_SITE_OTHER): Payer: Self-pay | Admitting: Obstetrics and Gynecology

## 2019-01-11 ENCOUNTER — Ambulatory Visit: Payer: Self-pay

## 2019-01-11 VITALS — BP 142/90 | HR 79 | Wt 166.2 lb

## 2019-01-11 DIAGNOSIS — O10919 Unspecified pre-existing hypertension complicating pregnancy, unspecified trimester: Secondary | ICD-10-CM

## 2019-01-11 DIAGNOSIS — Z789 Other specified health status: Secondary | ICD-10-CM

## 2019-01-11 DIAGNOSIS — O0990 Supervision of high risk pregnancy, unspecified, unspecified trimester: Secondary | ICD-10-CM

## 2019-01-11 DIAGNOSIS — Z98891 History of uterine scar from previous surgery: Secondary | ICD-10-CM

## 2019-01-11 DIAGNOSIS — O99283 Endocrine, nutritional and metabolic diseases complicating pregnancy, third trimester: Secondary | ICD-10-CM

## 2019-01-11 DIAGNOSIS — O09523 Supervision of elderly multigravida, third trimester: Secondary | ICD-10-CM

## 2019-01-11 DIAGNOSIS — O0993 Supervision of high risk pregnancy, unspecified, third trimester: Secondary | ICD-10-CM

## 2019-01-11 DIAGNOSIS — Z3A33 33 weeks gestation of pregnancy: Secondary | ICD-10-CM

## 2019-01-11 DIAGNOSIS — E039 Hypothyroidism, unspecified: Secondary | ICD-10-CM

## 2019-01-11 DIAGNOSIS — O10913 Unspecified pre-existing hypertension complicating pregnancy, third trimester: Secondary | ICD-10-CM

## 2019-01-11 DIAGNOSIS — O24410 Gestational diabetes mellitus in pregnancy, diet controlled: Secondary | ICD-10-CM

## 2019-01-11 NOTE — Progress Notes (Signed)
Interpreter [name] present for encounter.  

## 2019-01-11 NOTE — Progress Notes (Signed)
  Prenatal Visit Note Date: 01/11/2019 Clinic: [facility]  Subjective:  Sharon Brennan is a 43 y.o. [phone] at [redacted]w[redacted]d being seen today for ongoing prenatal care.  She is currently monitored for the following issues for this high-risk pregnancy and has Hypothyroidism affecting pregnancy; GERD (gastroesophageal reflux disease); History of VBAC; Supervision of high risk pregnancy, antepartum; Chronic hypertension during pregnancy, antepartum; AMA (advanced maternal age) multigravida 35+; and GDM (gestational diabetes mellitus) on their problem list.  Patient reports no complaints.   Contractions: Not present. Vag. Bleeding: None.  Movement: Present. Denies leaking of fluid.   The following portions of the patient's history were reviewed and updated as appropriate: allergies, current medications, past family history, past medical history, past social history, past surgical history and problem list. Problem list updated.  Objective:   Vitals:   01/11/19 1031  BP: (!) 142/90  Pulse: 79  Weight: 166 lb 3.2 oz (75.4 kg)    Fetal Status: Fetal Heart Rate (bpm): NST   Movement: Present     General:  Alert, oriented and cooperative. Patient is in no acute distress.  Skin: Skin is warm and dry. No rash noted.   Cardiovascular: Normal heart rate noted  Respiratory: Normal respiratory effort, no problems with respiration noted  Abdomen: Soft, gravid, appropriate for gestational age. Pain/Pressure: Present     Pelvic:  Cervical exam deferred        Extremities: Normal range of motion.  Edema: None  Mental Status: Normal mood and affect. Normal behavior. Normal judgment and thought content.   Urinalysis:      Assessment and Plan:  Pregnancy: [id number] at [redacted]w[redacted]d  1. Chronic hypertension during pregnancy, antepartum Doing well on just low dose asa bpp 10/10 Continue with weekly testing Has rpt growth on 9/22  2. Hypothyroidism affecting pregnancy in third  trimester Normal tsh in 3rd trimester on no meds  3. Supervision of high risk pregnancy, antepartum Routine care. BC options d/w pt  4. Multigravida of advanced maternal age in third trimester No issues  5. History of VBAC Consent signed today  6. GDMa1 2h PP dinner are in the 120s-130s mostly and a few am fastings in the high 90s. Salad dressing, rice and flour toritllas identified as possible issues. Recommend she avoid these and follow up next week   Interpreter used  Preterm labor symptoms and general obstetric precautions including but not limited to vaginal bleeding, contractions, leaking of fluid and fetal movement were reviewed in detail with the patient. Please refer to After Visit Summary for other counseling recommendations.  Return in about 1 week (around 01/18/2019) for NST/BPP/rn review CBG log and HOB *On 10/22 needs NST only @ 1015, [facility] @ 1115 (Has [place] @ [phone]).   Aletha Halim, MD

## 2019-01-17 ENCOUNTER — Encounter: Payer: Self-pay | Admitting: *Deleted

## 2019-01-18 ENCOUNTER — Ambulatory Visit: Payer: Self-pay

## 2019-01-18 ENCOUNTER — Ambulatory Visit (INDEPENDENT_AMBULATORY_CARE_PROVIDER_SITE_OTHER): Payer: Self-pay | Admitting: Obstetrics and Gynecology

## 2019-01-18 ENCOUNTER — Encounter: Payer: Self-pay | Admitting: Obstetrics and Gynecology

## 2019-01-18 ENCOUNTER — Other Ambulatory Visit: Payer: Self-pay

## 2019-01-18 VITALS — BP 118/86 | HR 108 | Wt 167.0 lb

## 2019-01-18 DIAGNOSIS — O10919 Unspecified pre-existing hypertension complicating pregnancy, unspecified trimester: Secondary | ICD-10-CM

## 2019-01-18 DIAGNOSIS — O0993 Supervision of high risk pregnancy, unspecified, third trimester: Secondary | ICD-10-CM

## 2019-01-18 DIAGNOSIS — O0990 Supervision of high risk pregnancy, unspecified, unspecified trimester: Secondary | ICD-10-CM

## 2019-01-18 DIAGNOSIS — O10913 Unspecified pre-existing hypertension complicating pregnancy, third trimester: Secondary | ICD-10-CM

## 2019-01-18 DIAGNOSIS — Z3A34 34 weeks gestation of pregnancy: Secondary | ICD-10-CM

## 2019-01-18 DIAGNOSIS — O09523 Supervision of elderly multigravida, third trimester: Secondary | ICD-10-CM

## 2019-01-18 DIAGNOSIS — E039 Hypothyroidism, unspecified: Secondary | ICD-10-CM

## 2019-01-18 DIAGNOSIS — O24410 Gestational diabetes mellitus in pregnancy, diet controlled: Secondary | ICD-10-CM

## 2019-01-18 DIAGNOSIS — O99283 Endocrine, nutritional and metabolic diseases complicating pregnancy, third trimester: Secondary | ICD-10-CM

## 2019-01-18 DIAGNOSIS — Z98891 History of uterine scar from previous surgery: Secondary | ICD-10-CM

## 2019-01-18 DIAGNOSIS — O10013 Pre-existing essential hypertension complicating pregnancy, third trimester: Secondary | ICD-10-CM

## 2019-01-18 DIAGNOSIS — E079 Disorder of thyroid, unspecified: Secondary | ICD-10-CM

## 2019-01-18 LAB — POCT URINALYSIS DIP (DEVICE)
Bilirubin Urine: NEGATIVE
Glucose, UA: NEGATIVE mg/dL
Hgb urine dipstick: NEGATIVE
Ketones, ur: NEGATIVE mg/dL
Leukocytes,Ua: NEGATIVE
Nitrite: NEGATIVE
Protein, ur: NEGATIVE mg/dL
Specific Gravity, Urine: 1.02 (ref 1.005–1.030)
Urobilinogen, UA: 0.2 mg/dL (ref 0.0–1.0)
pH: 6 (ref 5.0–8.0)

## 2019-01-18 NOTE — Progress Notes (Signed)
error 

## 2019-01-18 NOTE — Progress Notes (Signed)
Prenatal Visit Note Date: 01/18/2019 Clinic: [facility]  Subjective:  Sharon Brennan is a 43 y.o. [id number] at [redacted]w[redacted]d being seen today for ongoing prenatal care.  She is currently monitored for the following issues for this high-risk pregnancy and has Hypothyroidism affecting pregnancy; GERD (gastroesophageal reflux disease); History of VBAC; Supervision of high risk pregnancy, antepartum; Chronic hypertension during pregnancy, antepartum; AMA (advanced maternal age) multigravida 35+; and GDM (gestational diabetes mellitus) on their problem list.  Patient reports no complaints.   Contractions: Irritability. Vag. Bleeding: None.  Movement: Present. Denies leaking of fluid.   The following portions of the patient's history were reviewed and updated as appropriate: allergies, current medications, past family history, past medical history, past social history, past surgical history and problem list. Problem list updated.  Objective:   Vitals:   01/18/19 1401  BP: 118/86  Pulse: (!) 108  Weight: 167 lb (75.8 kg)    Fetal Status: Fetal Heart Rate (bpm): NST   Movement: Present     General:  Alert, oriented and cooperative. Patient is in no acute distress.  Skin: Skin is warm and dry. No rash noted.   Cardiovascular: Normal heart rate noted  Respiratory: Normal respiratory effort, no problems with respiration noted  Abdomen: Soft, gravid, appropriate for gestational age. Pain/Pressure: Present     Pelvic:  Cervical exam deferred        Extremities: Normal range of motion.  Edema: None  Mental Status: Normal mood and affect. Normal behavior. Normal judgment and thought content.   Urinalysis:      Assessment and Plan:  Pregnancy: [id number] at [redacted]w[redacted]d 1. Chronic hypertension during pregnancy, antepartum Doing well on just low dose asa bpp 10/10 Continue with weekly testing Has rpt growth on 10/22  2. Hypothyroidism affecting pregnancy in third trimester Normal  tsh in 3rd trimester on synthroid 50 qday. Refill sent in   3. Supervision of high risk pregnancy, antepartum Routine care.   89. Multigravida of advanced maternal age in third trimester No issues  5. History of VBAC -consent already signed  6. GDMa1 Forgot log book but gives normal #. Check next week  Interpreter used  Preterm labor symptoms and general obstetric precautions including but not limited to vaginal bleeding, contractions, leaking of fluid and fetal movement were reviewed in detail with the patient. Please refer to After Visit Summary for other counseling recommendations.  Return in about 1 week (around 01/25/2019) for NST/BPP .  2wk hrob visit, growth, nst/bpp   Aletha Halim, MD

## 2019-01-18 NOTE — Progress Notes (Signed)
Pt informed that the ultrasound is considered a limited OB ultrasound and is not intended to be a complete ultrasound exam.  Patient also informed that the ultrasound is not being completed with the intent of assessing for fetal or placental anomalies or any pelvic abnormalities.  Explained that the purpose of today's ultrasound is to assess for  BPP, presentation and AFI.  Patient acknowledges the purpose of the exam and the limitations of the study.    Osei Anger H RN BSN 01/18/19  

## 2019-01-25 ENCOUNTER — Ambulatory Visit (INDEPENDENT_AMBULATORY_CARE_PROVIDER_SITE_OTHER): Payer: Self-pay | Admitting: General Practice

## 2019-01-25 ENCOUNTER — Ambulatory Visit: Payer: Self-pay

## 2019-01-25 ENCOUNTER — Other Ambulatory Visit: Payer: Self-pay

## 2019-01-25 VITALS — BP 120/90 | HR 81 | Wt 167.0 lb

## 2019-01-25 DIAGNOSIS — O10919 Unspecified pre-existing hypertension complicating pregnancy, unspecified trimester: Secondary | ICD-10-CM

## 2019-01-25 DIAGNOSIS — O24410 Gestational diabetes mellitus in pregnancy, diet controlled: Secondary | ICD-10-CM

## 2019-01-25 DIAGNOSIS — Z3A35 35 weeks gestation of pregnancy: Secondary | ICD-10-CM

## 2019-01-25 DIAGNOSIS — O0990 Supervision of high risk pregnancy, unspecified, unspecified trimester: Secondary | ICD-10-CM

## 2019-01-25 DIAGNOSIS — O09523 Supervision of elderly multigravida, third trimester: Secondary | ICD-10-CM

## 2019-01-25 DIAGNOSIS — O10013 Pre-existing essential hypertension complicating pregnancy, third trimester: Secondary | ICD-10-CM

## 2019-01-25 NOTE — Progress Notes (Signed)
Pt informed that the ultrasound is considered a limited OB ultrasound and is not intended to be a complete ultrasound exam.  Patient also informed that the ultrasound is not being completed with the intent of assessing for fetal or placental anomalies or any pelvic abnormalities.  Explained that the purpose of today's ultrasound is to assess for  BPP, presentation and AFI.  Patient acknowledges the purpose of the exam and the limitations of the study.    Koren Bound RN BSN 01/25/19

## 2019-01-25 NOTE — Progress Notes (Signed)
BP slightly elevated today, has CHTN no meds- patient denies blurry vision, dizziness, or headaches. Reviewed with Kerry Hough- patient can follow up next week.

## 2019-01-27 MED FILL — LEVOTHYROXINE 50 MCG TABLET: 50 | 30 days supply | Qty: 30 | Fill #0

## 2019-01-31 ENCOUNTER — Ambulatory Visit (HOSPITAL_COMMUNITY): Payer: Self-pay

## 2019-02-02 ENCOUNTER — Ambulatory Visit (HOSPITAL_COMMUNITY)
Admission: RE | Admit: 2019-02-02 | Discharge: 2019-02-02 | Disposition: A | Discharge status: Home / Self Care | Payer: PRIVATE HEALTH INSURANCE | Source: Ambulatory Visit | Attending: Maternal & Fetal Medicine | Admitting: Maternal & Fetal Medicine

## 2019-02-02 ENCOUNTER — Other Ambulatory Visit: Payer: Self-pay

## 2019-02-02 ENCOUNTER — Ambulatory Visit (INDEPENDENT_AMBULATORY_CARE_PROVIDER_SITE_OTHER): Payer: PRIVATE HEALTH INSURANCE | Admitting: Obstetrics & Gynecology

## 2019-02-02 ENCOUNTER — Ambulatory Visit (HOSPITAL_COMMUNITY): Payer: Self-pay | Admitting: *Deleted

## 2019-02-02 ENCOUNTER — Encounter (HOSPITAL_COMMUNITY): Payer: Self-pay

## 2019-02-02 ENCOUNTER — Ambulatory Visit (INDEPENDENT_AMBULATORY_CARE_PROVIDER_SITE_OTHER): Payer: PRIVATE HEALTH INSURANCE | Admitting: General Practice

## 2019-02-02 VITALS — BP 125/83 | HR 82 | Wt 167.0 lb

## 2019-02-02 DIAGNOSIS — Z98891 History of uterine scar from previous surgery: Secondary | ICD-10-CM | POA: Insufficient documentation

## 2019-02-02 DIAGNOSIS — Z362 Encounter for other antenatal screening follow-up: Secondary | ICD-10-CM

## 2019-02-02 DIAGNOSIS — Z3A36 36 weeks gestation of pregnancy: Secondary | ICD-10-CM

## 2019-02-02 DIAGNOSIS — O10919 Unspecified pre-existing hypertension complicating pregnancy, unspecified trimester: Secondary | ICD-10-CM

## 2019-02-02 DIAGNOSIS — O10013 Pre-existing essential hypertension complicating pregnancy, third trimester: Secondary | ICD-10-CM

## 2019-02-02 DIAGNOSIS — O0993 Supervision of high risk pregnancy, unspecified, third trimester: Secondary | ICD-10-CM

## 2019-02-02 DIAGNOSIS — O24410 Gestational diabetes mellitus in pregnancy, diet controlled: Secondary | ICD-10-CM

## 2019-02-02 DIAGNOSIS — O09523 Supervision of elderly multigravida, third trimester: Secondary | ICD-10-CM

## 2019-02-02 DIAGNOSIS — O10913 Unspecified pre-existing hypertension complicating pregnancy, third trimester: Secondary | ICD-10-CM

## 2019-02-02 DIAGNOSIS — O0990 Supervision of high risk pregnancy, unspecified, unspecified trimester: Secondary | ICD-10-CM | POA: Insufficient documentation

## 2019-02-02 DIAGNOSIS — Z113 Encounter for screening for infections with a predominantly sexual mode of transmission: Secondary | ICD-10-CM

## 2019-02-02 NOTE — Progress Notes (Signed)
   PRENATAL VISIT NOTE  Subjective:  Sharon Brennan is a 42 y.o. [id number] at [redacted]w[redacted]d being seen today for ongoing prenatal care.  She is currently monitored for the following issues for this high-risk pregnancy and has Hypothyroidism affecting pregnancy; GERD (gastroesophageal reflux disease); History of VBAC; Supervision of high risk pregnancy, antepartum; Chronic hypertension during pregnancy, antepartum; AMA (advanced maternal age) multigravida 35+; and GDM (gestational diabetes mellitus) on their problem list.  Patient reports backache.  Contractions: Irregular. Vag. Bleeding: None.  Movement: Present. Denies leaking of fluid.   The following portions of the patient's history were reviewed and updated as appropriate: allergies, current medications, past family history, past medical history, past social history, past surgical history and problem list.   Objective:   Vitals:   02/02/19 1046  BP: 125/83  Pulse: 82  Weight: 167 lb (75.8 kg)    Fetal Status: Fetal Heart Rate (bpm): NST   Movement: Present  Presentation: Vertex  General:  Alert, oriented and cooperative. Patient is in no acute distress.  Skin: Skin is warm and dry. No rash noted.   Cardiovascular: Normal heart rate noted  Respiratory: Normal respiratory effort, no problems with respiration noted  Abdomen: Soft, gravid, appropriate for gestational age.  Pain/Pressure: Present     Pelvic: Cervical exam performed Dilation: 1 Effacement (%): 70 Station: -3  Extremities: Normal range of motion.  Edema: None  Mental Status: Normal mood and affect. Normal behavior. Normal judgment and thought content.   Assessment and Plan:  Pregnancy: [id number] at [redacted]w[redacted]d 1. Chronic hypertension during pregnancy, antepartum Borderline elevation today  2. Diet controlled gestational diabetes mellitus (GDM) in third trimester FBS 100-105 in last few days  3. Supervision of high risk pregnancy, antepartum Routine testing - Culture, beta  strep (group b only) - GC/Chlamydia probe amp ([facility])not at [facility]  4. Multigravida of advanced maternal age in third trimester   Preterm labor symptoms and general obstetric precautions including but not limited to vaginal bleeding, contractions, leaking of fluid and fetal movement were reviewed in detail with the patient. Please refer to After Visit Summary for other counseling recommendations.   Return in about 1 week (around 02/09/2019).  No future appointments.  Emeterio Reeve, MD

## 2019-02-02 NOTE — Patient Instructions (Signed)
Tercer trimestre de embarazo Third Trimester of Pregnancy  El tercer trimestre comprende desde la semana28 hasta la semana40 (desde el mes7 hasta el mes9). En este trimestre, el beb en gestacin (feto) crece muy rpidamente. Hacia el final del noveno mes, el beb en gestacin mide alrededor de 20pulgadas (45cm) de largo. Pesa entre 6y 10libras (2,70y 4,50kg). Siga estas indicaciones en su casa: Medicamentos  Tome los medicamentos de venta libre y los recetados solamente como se lo haya indicado el mdico. Algunos medicamentos son seguros para tomar durante el embarazo y otros no lo son.  Tome vitaminas prenatales que contengan por lo menos 600microgramos (?g) de cido flico.  Si tiene dificultad para mover el intestino (estreimiento), tome un medicamento para ablandar las heces (laxante) si su mdico se lo autoriza. Comida y bebida   Ingiera alimentos saludables de manera regular.  No coma carne cruda ni quesos sin cocinar.  Si obtiene poca cantidad de calcio de los alimentos que ingiere, consulte a su mdico sobre la posibilidad de tomar un suplemento diario de calcio.  La ingesta diaria de cuatro o cinco comidas pequeas en lugar de tres comidas abundantes.  Evite el consumo de alimentos ricos en grasas y azcares, como los alimentos fritos y los dulces.  Para evitar el estreimiento: ? Consuma alimentos ricos en fibra, como frutas y verduras frescas, cereales integrales y frijoles. ? Beba suficiente lquido para mantener el pis (orina) claro o de color amarillo plido. Actividad  Haga ejercicios solamente como se lo haya indicado el mdico. Interrumpa la actividad fsica si comienza a tener calambres.  No levante objetos pesados, use zapatos de tacones bajos y sintese derecha.  No haga ejercicio si hace demasiado calor, hay demasiada humedad o se encuentra en un lugar de mucha altura (altitud alta).  Puede continuar teniendo relaciones sexuales, a menos que el  mdico le indique lo contrario. Alivio del dolor y del malestar  Use un sostn que le brinde buen soporte si sus mamas estn sensibles.  Haga pausas frecuentes y descanse con las piernas levantadas si tiene calambres en las piernas o dolor en la zona lumbar.  Dese baos de asiento con agua tibia para aliviar el dolor o las molestias causadas por las hemorroides. Use una crema para las hemorroides si el mdico la autoriza.  Si desarrolla venas hinchadas y abultadas (vrices) en las piernas: ? Use medias de compresin o medias de descanso como se lo haya indicado el mdico. ? Levante (eleve) los pies durante 15minutos, 3 o 4veces por da. ? Limite el consumo de sal en sus alimentos. Seguridad  Colquese el cinturn de seguridad cuando conduzca.  Haga una lista de los nmeros de telfono de emergencia, que incluya los nmeros de telfono de familiares, amigos, el hospital, as como los departamentos de polica y bomberos. Preparacin para la llegada del beb Para prepararse para la llegada de su beb:  Tome clases prenatales.  Practique ir manejando al hospital.  Visite el hospital y recorra el rea de maternidad.  Hable en su trabajo acerca de tomar licencia cuando llegue el beb.  Prepare el bolso que llevar al hospital.  Prepare la habitacin del beb.  Concurra a los controles mdicos.  Compre un asiento de seguridad orientado hacia atrs para llevar al beb en el automvil. Aprenda cmo instalarlo en el auto. Instrucciones generales  No se d baos de inmersin en agua caliente, baos turcos ni saunas.  No consuma ningn producto que contenga nicotina o tabaco, como cigarrillos y cigarrillos   electrnicos. Si necesita ayuda para dejar de fumar, consulte al mdico.  No beba alcohol.  No se haga duchas vaginales ni use tampones o toallas higinicas perfumadas.  No mantenga las piernas cruzadas durante mucho tiempo.  No haga viajes de larga distancia, excepto si es  obligatorio. Hgalos solamente si su mdico la autoriza.  Visite a su dentista si no lo ha hecho durante el embarazo. Use un cepillo de cerdas suaves para cepillarse los dientes. Psese el hilo dental con suavidad.  Evite el contacto con las bandejas sanitarias de los gatos y la tierra que estos animales usan. Estos elementos contienen bacterias que pueden causar defectos congnitos al beb y la posible prdida del beb (aborto espontneo) o la muerte fetal.  Concurra a todas las visitas prenatales como se lo haya indicado el mdico. Esto es importante. Comunquese con un mdico si:  No est segura de si est en trabajo de parto o si ha roto la bolsa de las aguas.  Tiene mareos.  Tiene clicos leves o siente presin en la parte baja del vientre.  Sufre un dolor persistente en el abdomen.  Sigue teniendo malestar estomacal, vomita o tiene heces lquidas.  Advierte un lquido con olor ftido que proviene de la vagina.  Siente dolor al orinar. Solicite ayuda de inmediato si:  Tiene fiebre.  Tiene una prdida de lquido por la vagina.  Tiene sangrado o pequeas prdidas vaginales.  Siente dolor intenso o clicos en el abdomen.  Aumenta o baja de peso rpidamente.  Tiene dificultades para recuperar el aliento y siente dolor en el pecho.  Sbitamente se le hinchan mucho el rostro, las manos, los tobillos, los pies o las piernas.  No ha sentido los movimientos del beb durante una hora.  Siente un dolor de cabeza intenso que no se alivia con medicamentos.  Tiene dificultad para ver.  Tiene prdida de lquido o le sale un chorro de lquido de la vagina antes de estar en la semana 37.  Tiene espasmos abdominales (contracciones) regulares antes de estar en la semana 37. Resumen  El tercer trimestre comprende desde la semana28 hasta la semana40 (desde el mes7 hasta el mes9). Esta es la poca en que el beb en gestacin crece muy rpidamente.  Siga los consejos del mdico  con respecto a los medicamentos, la alimentacin y la actividad.  Preprese para la llegada del beb tomando las clases prenatales, preparando todo lo que necesitar el beb, arreglando la habitacin del beb y concurriendo a los controles mdicos.  Solicite ayuda de inmediato si tiene sangrado por la vagina, siente dolor en el pecho o tiene dificultad para respirar, o si no ha sentido que su beb se mueve en el transcurso de ms de una hora. Esta informacin no tiene como fin reemplazar el consejo del mdico. Asegrese de hacerle al mdico cualquier pregunta que tenga. Document Released: 11/30/2012 Document Revised: 11/02/2016 Document Reviewed: 11/02/2016 Elsevier Patient Education  2020 [employer]  

## 2019-02-03 ENCOUNTER — Other Ambulatory Visit: Payer: Self-pay | Admitting: *Deleted

## 2019-02-03 DIAGNOSIS — O24419 Gestational diabetes mellitus in pregnancy, unspecified control: Secondary | ICD-10-CM

## 2019-02-03 LAB — GC/CHLAMYDIA PROBE AMP (~~LOC~~) NOT AT ARMC
Chlamydia: NEGATIVE
Comment: NEGATIVE
Comment: NORMAL
Neisseria Gonorrhea: NEGATIVE

## 2019-02-05 LAB — CULTURE, BETA STREP (GROUP B ONLY): Strep Gp B Culture: NEGATIVE

## 2019-02-08 ENCOUNTER — Ambulatory Visit (INDEPENDENT_AMBULATORY_CARE_PROVIDER_SITE_OTHER): Payer: Self-pay | Admitting: Obstetrics and Gynecology

## 2019-02-08 ENCOUNTER — Ambulatory Visit (INDEPENDENT_AMBULATORY_CARE_PROVIDER_SITE_OTHER): Payer: Self-pay | Admitting: General Practice

## 2019-02-08 ENCOUNTER — Encounter: Payer: Self-pay | Admitting: Obstetrics and Gynecology

## 2019-02-08 ENCOUNTER — Other Ambulatory Visit: Payer: Self-pay

## 2019-02-08 ENCOUNTER — Ambulatory Visit: Payer: Self-pay

## 2019-02-08 ENCOUNTER — Other Ambulatory Visit: Payer: Self-pay | Admitting: Family Medicine

## 2019-02-08 ENCOUNTER — Encounter (HOSPITAL_COMMUNITY): Payer: Self-pay

## 2019-02-08 ENCOUNTER — Inpatient Hospital Stay (HOSPITAL_COMMUNITY)
Admission: AD | Admit: 2019-02-08 | Discharge: 2019-02-10 | DRG: 806 | Disposition: A | Discharge status: Home / Self Care | Payer: Medicaid Other | Attending: Obstetrics & Gynecology | Admitting: Obstetrics & Gynecology

## 2019-02-08 VITALS — BP 136/85 | HR 92 | Wt 169.0 lb

## 2019-02-08 DIAGNOSIS — O119 Pre-existing hypertension with pre-eclampsia, unspecified trimester: Secondary | ICD-10-CM | POA: Diagnosis present

## 2019-02-08 DIAGNOSIS — E039 Hypothyroidism, unspecified: Secondary | ICD-10-CM | POA: Diagnosis present

## 2019-02-08 DIAGNOSIS — O0993 Supervision of high risk pregnancy, unspecified, third trimester: Secondary | ICD-10-CM

## 2019-02-08 DIAGNOSIS — O134 Gestational [pregnancy-induced] hypertension without significant proteinuria, complicating childbirth: Secondary | ICD-10-CM | POA: Diagnosis not present

## 2019-02-08 DIAGNOSIS — Z758 Other problems related to medical facilities and other health care: Secondary | ICD-10-CM | POA: Diagnosis present

## 2019-02-08 DIAGNOSIS — O24410 Gestational diabetes mellitus in pregnancy, diet controlled: Secondary | ICD-10-CM

## 2019-02-08 DIAGNOSIS — O09523 Supervision of elderly multigravida, third trimester: Secondary | ICD-10-CM

## 2019-02-08 DIAGNOSIS — O0990 Supervision of high risk pregnancy, unspecified, unspecified trimester: Secondary | ICD-10-CM

## 2019-02-08 DIAGNOSIS — O24420 Gestational diabetes mellitus in childbirth, diet controlled: Secondary | ICD-10-CM | POA: Diagnosis present

## 2019-02-08 DIAGNOSIS — Z3A37 37 weeks gestation of pregnancy: Secondary | ICD-10-CM

## 2019-02-08 DIAGNOSIS — O99283 Endocrine, nutritional and metabolic diseases complicating pregnancy, third trimester: Secondary | ICD-10-CM

## 2019-02-08 DIAGNOSIS — O10919 Unspecified pre-existing hypertension complicating pregnancy, unspecified trimester: Secondary | ICD-10-CM

## 2019-02-08 DIAGNOSIS — O99284 Endocrine, nutritional and metabolic diseases complicating childbirth: Secondary | ICD-10-CM | POA: Diagnosis present

## 2019-02-08 DIAGNOSIS — Z789 Other specified health status: Secondary | ICD-10-CM | POA: Diagnosis present

## 2019-02-08 DIAGNOSIS — Z7982 Long term (current) use of aspirin: Secondary | ICD-10-CM | POA: Diagnosis not present

## 2019-02-08 DIAGNOSIS — Z98891 History of uterine scar from previous surgery: Secondary | ICD-10-CM

## 2019-02-08 DIAGNOSIS — O09529 Supervision of elderly multigravida, unspecified trimester: Secondary | ICD-10-CM

## 2019-02-08 DIAGNOSIS — O34219 Maternal care for unspecified type scar from previous cesarean delivery: Secondary | ICD-10-CM | POA: Diagnosis present

## 2019-02-08 DIAGNOSIS — Z23 Encounter for immunization: Secondary | ICD-10-CM | POA: Diagnosis not present

## 2019-02-08 DIAGNOSIS — O99280 Endocrine, nutritional and metabolic diseases complicating pregnancy, unspecified trimester: Secondary | ICD-10-CM | POA: Diagnosis present

## 2019-02-08 DIAGNOSIS — O1002 Pre-existing essential hypertension complicating childbirth: Secondary | ICD-10-CM | POA: Diagnosis present

## 2019-02-08 DIAGNOSIS — O114 Pre-existing hypertension with pre-eclampsia, complicating childbirth: Secondary | ICD-10-CM | POA: Diagnosis present

## 2019-02-08 DIAGNOSIS — Z20828 Contact with and (suspected) exposure to other viral communicable diseases: Secondary | ICD-10-CM | POA: Diagnosis present

## 2019-02-08 DIAGNOSIS — O10913 Unspecified pre-existing hypertension complicating pregnancy, third trimester: Secondary | ICD-10-CM

## 2019-02-08 DIAGNOSIS — O24419 Gestational diabetes mellitus in pregnancy, unspecified control: Secondary | ICD-10-CM | POA: Diagnosis present

## 2019-02-08 HISTORY — DX: Hypothyroidism, unspecified: E03.9

## 2019-02-08 LAB — GLUCOSE, CAPILLARY
Glucose-Capillary: 81 mg/dL (ref 70–99)
Glucose-Capillary: 83 mg/dL (ref 70–99)

## 2019-02-08 LAB — ABO/RH: ABO/RH(D): O POS

## 2019-02-08 LAB — COMPREHENSIVE METABOLIC PANEL
ALT: 22 U/L (ref 0–44)
AST: 24 U/L (ref 15–41)
Albumin: 3 g/dL — ABNORMAL LOW (ref 3.5–5.0)
Alkaline Phosphatase: 190 U/L — ABNORMAL HIGH (ref 38–126)
Anion gap: 9 (ref 5–15)
BUN: 16 mg/dL (ref 6–20)
CO2: 20 mmol/L — ABNORMAL LOW (ref 22–32)
Calcium: 9 mg/dL (ref 8.9–10.3)
Chloride: 106 mmol/L (ref 98–111)
Creatinine, Ser: 0.39 mg/dL — ABNORMAL LOW (ref 0.44–1.00)
GFR calc Af Amer: >60 mL/min (ref >60)
GFR calc non Af Amer: >60 mL/min (ref >60)
Glucose, Bld: 87 mg/dL (ref 70–99)
Potassium: 4 mmol/L (ref 3.5–5.1)
Sodium: 135 mmol/L (ref 135–145)
Total Bilirubin: 0.1 mg/dL — ABNORMAL LOW (ref 0.3–1.2)
Total Protein: 6.8 g/dL (ref 6.5–8.1)

## 2019-02-08 LAB — PROTEIN / CREATININE RATIO, URINE
Creatinine, Urine: 37.81 mg/dL
Protein Creatinine Ratio: 0.29 mg/mg{Cre} — ABNORMAL HIGH (ref 0.00–0.15)
Total Protein, Urine: 11 mg/dL

## 2019-02-08 LAB — TYPE AND SCREEN
ABO/RH(D): O POS
Antibody Screen: NEGATIVE

## 2019-02-08 LAB — CBC
HCT: 40.7 % (ref 36.0–46.0)
Hemoglobin: 13.6 g/dL (ref 12.0–15.0)
MCH: 30.7 pg (ref 26.0–34.0)
MCHC: 33.4 g/dL (ref 30.0–36.0)
MCV: 91.9 fL (ref 80.0–100.0)
Platelets: 177 10*3/uL (ref 150–400)
RBC: 4.43 MIL/uL (ref 3.87–5.11)
RDW: 13.4 % (ref 11.5–15.5)
WBC: 8.3 10*3/uL (ref 4.0–10.5)
nRBC: 0 % (ref 0.0–0.2)

## 2019-02-08 LAB — SARS CORONAVIRUS 2 BY RT PCR (HOSPITAL ORDER, PERFORMED IN ~~LOC~~ HOSPITAL LAB): SARS Coronavirus 2: NEGATIVE

## 2019-02-08 MED ORDER — LIDOCAINE HCL (PF) 1 % IJ SOLN: 30.0000 mL | INTRAMUSCULAR | Status: DC | PRN

## 2019-02-08 MED ORDER — FENTANYL CITRATE (PF) 100 MCG/2ML IJ SOLN
100.0000 ug | INTRAMUSCULAR | Status: DC | PRN
  Administered 2019-02-08 – 2019-02-09 (×2): 100 ug via INTRAVENOUS
  Filled 2019-02-08 (×2): qty 2

## 2019-02-08 MED ORDER — TERBUTALINE SULFATE 1 MG/ML IJ SOLN: 0.2500 mg | Freq: Once | INTRAMUSCULAR | Status: DC | PRN

## 2019-02-08 MED ORDER — ACETAMINOPHEN 325 MG PO TABS
650.0000 mg | ORAL_TABLET | ORAL | Status: DC | PRN
  Administered 2019-02-09: 650 mg via ORAL
  Filled 2019-02-08: qty 2

## 2019-02-08 MED ORDER — LACTATED RINGERS IV SOLN: 500.0000 mL | INTRAVENOUS | Status: DC | PRN

## 2019-02-08 MED ORDER — OXYTOCIN 40 UNITS IN NORMAL SALINE INFUSION - SIMPLE MED
2.5000 [IU]/h | INTRAVENOUS | Status: DC
  Filled 2019-02-08 (×2): qty 1000

## 2019-02-08 MED ORDER — OXYTOCIN BOLUS FROM INFUSION
500.0000 mL | Freq: Once | INTRAVENOUS | Status: AC
  Administered 2019-02-09: 500 mL via INTRAVENOUS

## 2019-02-08 MED ORDER — OXYTOCIN 40 UNITS IN NORMAL SALINE INFUSION - SIMPLE MED
1.0000 m[IU]/min | INTRAVENOUS | Status: DC
  Administered 2019-02-08: 1 m[IU]/min via INTRAVENOUS
  Administered 2019-02-08: 2 m[IU]/min via INTRAVENOUS

## 2019-02-08 MED ORDER — LACTATED RINGERS IV SOLN
INTRAVENOUS | Status: DC
  Administered 2019-02-08 (×2): via INTRAVENOUS

## 2019-02-08 MED ORDER — OXYCODONE-ACETAMINOPHEN 5-325 MG PO TABS: 1.0000 | ORAL_TABLET | ORAL | Status: DC | PRN

## 2019-02-08 MED ORDER — ONDANSETRON HCL 4 MG/2ML IJ SOLN
4.0000 mg | Freq: Four times a day (QID) | INTRAMUSCULAR | Status: DC | PRN
  Administered 2019-02-08: 4 mg via INTRAVENOUS
  Filled 2019-02-08: qty 2

## 2019-02-08 MED ORDER — SOD CITRATE-CITRIC ACID 500-334 MG/5ML PO SOLN: 30.0000 mL | ORAL | Status: DC | PRN

## 2019-02-08 MED ORDER — OXYCODONE-ACETAMINOPHEN 5-325 MG PO TABS: 2.0000 | ORAL_TABLET | ORAL | Status: DC | PRN

## 2019-02-08 NOTE — Progress Notes (Addendum)
Subjective: In to introduce self to patient. Pt is reporting frequent, painful contractions. Requesting IV pain medication. Support person at bedside.   Objective: BP (!) 151/88   Pulse 80   Temp 98.7 F (37.1 C) (Oral)   Resp 16   Ht 4\' 11"  (1.499 m)   Wt 75.3 kg   LMP 05/24/2018 (Exact Date)   BMI 33.53 kg/m   Fetal Well-Being:  -- Cat 1 tracing; FHR 150, moderate variability, +accels, no decels  Toco: -- Contractions q2-4 mins  Dilation: 4.5 Effacement (%): 70 Cervical Position: Posterior Station: -2 Presentation: Vertex Exam by:: Maryagnes Amos, CNM  Assessment and Plan: 43 y.o. [id number] [redacted]w[redacted]d admitted for IOL for cHTN and BPP 6/8.   Labor: -- S/p foley bulb -- Continue Pitocin titration -- GBS negative -- Cat 1 tracing -- Pain control: planning IV pain medication -- PPH Risk: high  CHTN: -- BP stable -- PCR 0.29; other pre-e labs WNL  A1GDM: -- CBGs stable -- Continue q4H checks   [place] Ardell Isaacs [id number] used for this patient interaction   Maryagnes Amos, SNM 11:16 PM

## 2019-02-08 NOTE — Progress Notes (Signed)
Pt informed that the ultrasound is considered a limited OB ultrasound and is not intended to be a complete ultrasound exam.  Patient also informed that the ultrasound is not being completed with the intent of assessing for fetal or placental anomalies or any pelvic abnormalities.  Explained that the purpose of today's ultrasound is to assess for  BPP, presentation and AFI.  Patient acknowledges the purpose of the exam and the limitations of the study.    Koren Bound RN BSN 02/08/19

## 2019-02-08 NOTE — Progress Notes (Signed)
Yesterday she had lower abdominal pain & she didn't sleep well.

## 2019-02-08 NOTE — Progress Notes (Signed)
   PRENATAL VISIT NOTE  Subjective:  Sharon Brennan is a 43 y.o. [id number] at [redacted]w[redacted]d being seen today for ongoing prenatal care.  She is currently monitored for the following issues for this high-risk pregnancy and has Hypothyroidism affecting pregnancy; GERD (gastroesophageal reflux disease); History of VBAC; Supervision of high risk pregnancy, antepartum; Chronic hypertension during pregnancy, antepartum; AMA (advanced maternal age) multigravida 35+; GDM (gestational diabetes mellitus); and Language barrier on their problem list.  Patient reports no complaints.  Contractions: Irritability. Vag. Bleeding: None.  Movement: Present. Denies leaking of fluid.   The following portions of the patient's history were reviewed and updated as appropriate: allergies, current medications, past family history, past medical history, past social history, past surgical history and problem list.   Objective:   Vitals:   02/08/19 1032 02/08/19 1034  BP: (!) 148/95 136/85  Pulse: 92 92  Weight: 169 lb (76.7 kg)     Fetal Status: Fetal Heart Rate (bpm): 146   Movement: Present     General:  Alert, oriented and cooperative. Patient is in no acute distress.  Skin: Skin is warm and dry. No rash noted.   Cardiovascular: Normal heart rate noted  Respiratory: Normal respiratory effort, no problems with respiration noted  Abdomen: Soft, gravid, appropriate for gestational age.  Pain/Pressure: Present     Pelvic: Cervical exam deferred        Extremities: Normal range of motion.  Edema: None  Mental Status: Normal mood and affect. Normal behavior. Normal judgment and thought content.   Assessment and Plan:  Pregnancy: F7P1025 at [redacted]w[redacted]d  1. Chronic hypertension during pregnancy, antepartum - cont baby ASA - no meds - BP > 140/90 initially today, but then repeat was lower - BPP 6/8 today  2. Supervision of high risk pregnancy, antepartum To L&D for induction  3. History of VBAC For TOLAC  4. Diet  controlled gestational diabetes mellitus (GDM) in third trimester Diet controlled FG: one 92, all > 95 PP: > 110  5. Hypothyroidism affecting pregnancy in third trimester Last TSH normal  6. Multigravida of advanced maternal age in third trimester  7. Language barrier Patent attorney used  - Given newly elevated BP, age and BPP, will send patient to L&D for induction of labor, she verbalizes understanding and is in agreement with plan - Staff at L&D aware   Term labor symptoms and general obstetric precautions including but not limited to vaginal bleeding, contractions, leaking of fluid and fetal movement were reviewed in detail with the patient. Please refer to After Visit Summary for other counseling recommendations.   Return in about 5 weeks (around 03/15/2019) for post partum check.  Future Appointments  Date Time Provider [place]  02/08/2019 11:35 AM [place]    Sloan Leiter, MD

## 2019-02-08 NOTE — H&P (Signed)
.Subjective:  Sharon Brennan is a 43 y.o. G5 P3 female with at [redacted]w[redacted]d gestation who is being admitted for induction of labor.  Her current obstetrical history is significant for A1 gestational DM (diet controlled), advanced maternal age, prior c-section, successful VBAC, chronic hypertension affecting pregnancy (baseline 120s/70s, not on anti-HTN meds) on aspirin.  Patient reports no complaints.   Fetal Movement: normal.   She does not desire epidural for pain management but is okay with IV pain meds. She initially planned for BTL for [facility], but due to expense, is considering other options. Will likely get either nexplanon or IUD with health department after discharge. Also considering vasectomy long-term. Plans to breast and bottle feed.   Reports first delivery was vaginal, second delivery was c-section 2/2 to ?transverse lie?, last delivery was VBAC.   Reports additional history of post-partum pre-eclampsia with pregnancy in 2012.   She currently denies any headache, vision changes, chest pain, SOB, upper abdominal pain, or LE edema.  She desires repeat TOLAC today, has previously signed consent in clinic on 01/11/2019.   Objective:   Vital signs in last 24 hours: Pulse Rate:  [92] 92 (10/28 1034) BP: (136-148)/(85-95) 136/85 (10/28 1034) Weight:  [76.7 kg] 76.7 kg (10/28 1032)   General:   alert, no distress and laying comfortably in bed  Skin:   normal  HEENT:  sclera clear, anicteric  Lungs:   breathing comfortably on room air  Heart:   regular rate appreciated  Breasts:   deferred        FHT:  NST with appropriate baseline rate 150bpm, moderate variability,  +accels, no decels, rare contractions  Uterine Size: size equals dates, leopolds ~3700g  Presentations: cephalic  Cervix:    Dilation: 2cm   Effacement: 80%   Station:  ballotable   Consistency: soft   Position: posterior   Per Chart Review: Cervical check on 10/22 was 1, 70%, and -3   Lab Review O, Rh+, Ab neg,  Rubella-immune, Hepatitis B surface antigen non-reactive, HIV NR  GBS negative 02/02/2019 GN/CT neg 02/02/2019 NIPS: Materni21 neg 2 hour GTT: abnormal Normal pap in 2017 Varicella non-immune  U/S on 10/22 demonstrated cephalic presentation, posterior placenta, infant weight ~73%ile, 2918g   Assessment/Plan:  37 and 1/[redacted] weeks gestation. Not in labor. Obstetrical history significant for post partum pre-eclampsia with prior pregnancy, A1 gestational DM, advanced maternal age, prior c-section, successful VBAC.     Risks, benefits, alternatives and possible complications have been discussed in detail with the patient.  Pre-admission, admission, and post admission procedures and expectations were discussed in detail.  All questions answered, all appropriate consents will be signed at the Hospital. Admission is planned for today.   Labor: ambulation, IV Pitocin induction and FB placed sucessfully. Start low dose pitocin. Analgesia: IM/IV narcotic. Considering epidural. FWB: Cat I GBS: neg COVD: swab negative MOF: Bottle and breast MOC: nexplanon vs IUD PP at health department Circ: N/a  CHTN: mild range BP's on arrival c/w course throughout pregnancy. Not currently on any anti-HTN meds. Asymptomatic. Repeat labs on admission notable for elevated (compared to baseline) but not yet diagnostic of PreE with UPCR of 0.29 and unremarkable CBC/CMP, monitor BP and symptoms closely.   GDMA1: diet controlled during pregnancy. Accucheck q4h in latent labor, q2h acive labor, if persistently >140 start insulin gtt     OB FELLOW ATTESTATION  The above note was created by a medical student. I have seen and examined this patient, repeated all of the history and  physical exam, and edited the above note to reflect any changes.   Augustin Coupe, MD/MP[facility] OB Fellow  02/08/2019, 3:46 PM

## 2019-02-09 ENCOUNTER — Encounter (HOSPITAL_COMMUNITY): Payer: Self-pay | Admitting: *Deleted

## 2019-02-09 DIAGNOSIS — O134 Gestational [pregnancy-induced] hypertension without significant proteinuria, complicating childbirth: Secondary | ICD-10-CM

## 2019-02-09 DIAGNOSIS — Z3A37 37 weeks gestation of pregnancy: Secondary | ICD-10-CM

## 2019-02-09 DIAGNOSIS — O24420 Gestational diabetes mellitus in childbirth, diet controlled: Secondary | ICD-10-CM

## 2019-02-09 DIAGNOSIS — O34219 Maternal care for unspecified type scar from previous cesarean delivery: Secondary | ICD-10-CM

## 2019-02-09 LAB — RPR: RPR Ser Ql: NONREACTIVE

## 2019-02-09 LAB — GLUCOSE, CAPILLARY: Glucose-Capillary: 96 mg/dL (ref 70–99)

## 2019-02-09 MED ORDER — INFLUENZA VAC SPLIT QUAD 0.5 ML IM SUSY
0.5000 mL | PREFILLED_SYRINGE | INTRAMUSCULAR | Status: AC
  Administered 2019-02-10: 0.5 mL via INTRAMUSCULAR
  Filled 2019-02-09: qty 0.5

## 2019-02-09 MED ORDER — ACETAMINOPHEN 325 MG PO TABS
650.0000 mg | ORAL_TABLET | ORAL | Status: DC | PRN
  Administered 2019-02-09 (×2): 650 mg via ORAL
  Filled 2019-02-09 (×2): qty 2

## 2019-02-09 MED ORDER — SIMETHICONE 80 MG PO CHEW: 80.0000 mg | CHEWABLE_TABLET | ORAL | Status: DC | PRN

## 2019-02-09 MED ORDER — LEVOTHYROXINE SODIUM 50 MCG PO TABS
50.0000 ug | ORAL_TABLET | Freq: Every day | ORAL | Status: DC
  Administered 2019-02-10: 50 ug via ORAL
  Filled 2019-02-09 (×2): qty 1

## 2019-02-09 MED ORDER — ZOLPIDEM TARTRATE 5 MG PO TABS: 5.0000 mg | ORAL_TABLET | Freq: Every evening | ORAL | Status: DC | PRN

## 2019-02-09 MED ORDER — COCONUT OIL OIL: 1.0000 "application " | TOPICAL_OIL | Status: DC | PRN

## 2019-02-09 MED ORDER — DIBUCAINE (PERIANAL) 1 % EX OINT: 1.0000 "application " | TOPICAL_OINTMENT | CUTANEOUS | Status: DC | PRN

## 2019-02-09 MED ORDER — IBUPROFEN 600 MG PO TABS
600.0000 mg | ORAL_TABLET | Freq: Four times a day (QID) | ORAL | Status: DC
  Administered 2019-02-09 – 2019-02-10 (×6): 600 mg via ORAL
  Filled 2019-02-09 (×6): qty 1

## 2019-02-09 MED ORDER — TETANUS-DIPHTH-ACELL PERTUSSIS 5-2.5-18.5 LF-MCG/0.5 IM SUSP
0.5000 mL | Freq: Once | INTRAMUSCULAR | Status: AC
  Administered 2019-02-10: 0.5 mL via INTRAMUSCULAR
  Filled 2019-02-09: qty 0.5

## 2019-02-09 MED ORDER — PRENATAL MULTIVITAMIN CH
1.0000 | ORAL_TABLET | Freq: Every day | ORAL | Status: DC
  Administered 2019-02-09 – 2019-02-10 (×2): 1 via ORAL
  Filled 2019-02-09 (×2): qty 1

## 2019-02-09 MED ORDER — OXYCODONE HCL 5 MG PO TABS
5.0000 mg | ORAL_TABLET | ORAL | Status: DC | PRN
  Administered 2019-02-09 – 2019-02-10 (×3): 5 mg via ORAL
  Filled 2019-02-09 (×3): qty 1

## 2019-02-09 MED ORDER — WITCH HAZEL-GLYCERIN EX PADS: 1.0000 "application " | MEDICATED_PAD | CUTANEOUS | Status: DC | PRN

## 2019-02-09 MED ORDER — SENNOSIDES-DOCUSATE SODIUM 8.6-50 MG PO TABS
2.0000 | ORAL_TABLET | ORAL | Status: DC
  Administered 2019-02-10: 2 via ORAL
  Filled 2019-02-09: qty 2

## 2019-02-09 MED ORDER — DIPHENHYDRAMINE HCL 25 MG PO CAPS: 25.0000 mg | ORAL_CAPSULE | Freq: Four times a day (QID) | ORAL | Status: DC | PRN

## 2019-02-09 MED ORDER — ONDANSETRON HCL 4 MG PO TABS: 4.0000 mg | ORAL_TABLET | ORAL | Status: DC | PRN

## 2019-02-09 MED ORDER — ONDANSETRON HCL 4 MG/2ML IJ SOLN: 4.0000 mg | INTRAMUSCULAR | Status: DC | PRN

## 2019-02-09 MED ORDER — MEASLES, MUMPS & RUBELLA VAC IJ SOLR: 0.5000 mL | Freq: Once | INTRAMUSCULAR | Status: DC

## 2019-02-09 MED ORDER — BENZOCAINE-MENTHOL 20-0.5 % EX AERO: 1.0000 "application " | INHALATION_SPRAY | CUTANEOUS | Status: DC | PRN

## 2019-02-09 NOTE — Lactation Note (Signed)
This note was copied from a baby's chart. Lactation Consultation Note  Patient Name: Girl Daveigh Batty [id number] Date: 02/09/2019   Video interpreter used for Spanish.  P4, 6 8 hours old.  Mother stated she wants to breastfeed and formula feed. Encouraged bf before offering formula. Mother did not bf her first child.  She bf her second child for 1 year and bf her 3rd child briefly due to being readmitted for preeclamsia. Mother states she does not have enough breastmilk but also stated she had too much breastmilk with her daughter and donated extra. Discussed early term feeding behavior and provided LPI information sheet in Spanish. Reviewed volume guidelines. Recommend mother post pump 4-6 times per day for 10-20 min with DEBP on initiation setting.  Fitted with #27 flanges and provided #30 flanges for discharge.  Give baby back volume pumped at the next feeding. Reviewed cleaning and milk storage.  Mom made aware of O/P services, breastfeeding support groups, community resources, and our phone # for post-discharge questions.  Feed on demand with cues.  Goal 8-12+ times per day after first 24 hrs.  Place baby STS if not cueing.  Observed feeding off and on in cross cradle although mother seems more comfortable in cradle hold.      Maternal Data    Feeding    LATCH Score                   Interventions    Lactation Tools Discussed/Used     Consult Status      Carlye Grippe 02/09/2019, 4:21 PM

## 2019-02-09 NOTE — Discharge Summary (Addendum)
Postpartum Discharge Summary     Patient Name: Sharon Brennan DOB: 21-May-1975 MRN: [id number]  Date of admission: 02/08/2019 Delivering Provider: Renee Harder   Date of discharge: 02/10/2019  Admitting diagnosis: INDUCTION Intrauterine pregnancy: [redacted]w[redacted]d    Secondary diagnosis:  Active Problems:   Hypothyroidism affecting pregnancy   History of VBAC   AMA (advanced maternal age) multigravida 35+   GDM (gestational diabetes mellitus)   Language barrier   Chronic hypertension with superimposed preeclampsia   [redacted] weeks gestation of pregnancy  Additional problems: none     Discharge diagnosis: Term Pregnancy Delivered, VBAC, CHTN and GDM A1                                                                                                Post partum procedures:None  Augmentation: AROM, Pitocin and Foley Balloon  Complications: None  Hospital course:  Induction of Labor With Vaginal Delivery   43y.o. yo [phone] [id number] admitted to the hospital 02/08/2019 for induction of labor.  Indication for induction: cHTN with NRBPP. She had a BPP 6/8 in the office, combined with newly elevated BP and pt being AMA led to her IOL. She progressed to an uncomplicated VBAC after cervical foley/Pit/AROM.   Membrane Rupture Time/Date: 4:58 AM ,02/09/2019   Intrapartum Procedures: Episiotomy: None [1]                                         Lacerations:  None [1]  Patient had delivery of a Viable infant.  Information for the patient's newborn:  CrVonna Draftsirl [id number]]    02/09/2019  Details of delivery can be found in separate delivery note.  Patient had a routine postpartum course. Amlodipine 5 mg initiated and prescribed on discharge. Fasting glucose 96. Patient would like Nexplanon at post-partum visit. Patient is discharged home 02/10/19. Delivery time: 7:34 AM    Magnesium Sulfate received: No BMZ received:  No Rhophylac:N/A MMR:N/A Transfusion:No  Physical exam  Vitals:   02/09/19 1700 02/09/19 1844 02/09/19 2011 02/10/19 0534  BP: (!) 149/81 (!) 147/82 137/80 135/81  Pulse: 65 77 85 73  Resp: '18  18 20  ' Temp: 98.3 F (36.8 C)  98.5 F (36.9 C) 97.9 F (36.6 C)  TempSrc: Oral  Oral Oral  SpO2: 100%  100% 99%  Weight:      Height:       General: alert, cooperative and no distress Lochia: appropriate Uterine Fundus: firm DVT Evaluation: No evidence of DVT seen on physical exam. Labs: Lab Results  Component Value Date   WBC 8.3 02/08/2019   HGB 13.6 02/08/2019   HCT 40.7 02/08/2019   MCV 91.9 02/08/2019   PLT 177 02/08/2019   CMP Latest Ref Rng & Units 02/08/2019  Glucose 70 - 99 mg/dL 87  BUN 6 - 20 mg/dL 16  Creatinine 0.44 - 1.00 mg/dL 0.39(L)  Sodium 135 - 145 mmol/L 135  Potassium 3.5 - 5.1 mmol/L 4.0  Chloride  98 - 111 mmol/L 106  CO2 22 - 32 mmol/L 20(L)  Calcium 8.9 - 10.3 mg/dL 9.0  Total Protein 6.5 - 8.1 g/dL 6.8  Total Bilirubin 0.3 - 1.2 mg/dL 0.1(L)  Alkaline Phos 38 - 126 U/L 190(H)  AST 15 - 41 U/L 24  ALT 0 - 44 U/L 22    Discharge instruction: per After Visit Summary and "Baby and Me Booklet".  After visit meds:  Allergies as of 02/10/2019      Reactions   Pollen Extract       Medication List    STOP taking these medications   aspirin EC 81 MG tablet     TAKE these medications   amLODipine 5 MG tablet Commonly known as: NORVASC Take 1 tablet (5 mg total) by mouth daily.   ibuprofen 600 MG tablet Commonly known as: ADVIL Take 1 tablet (600 mg total) by mouth every 6 (six) hours as needed.   levothyroxine 50 MCG tablet Commonly known as: SYNTHROID Take 1 tablet (50 mcg total) by mouth daily before breakfast. Reported on 04/01/2015   PRENATAL VITAMIN PO Take by mouth.   Tylenol 325 MG Caps Generic drug: Acetaminophen Take 1 tablet by mouth as needed.       Diet: routine diet  Activity: Advance as tolerated. Pelvic rest  for 6 weeks.   Outpatient follow up:1wk BP check, then 4wk PP visit with GTT Follow up Appt: Future Appointments  Date Time Provider [place]  02/16/2019  9:20 AM Winton Boyd  03/15/2019  1:55 PM [place] Mall, Henrietta Witmer  03/23/2019  8:20 AM [facility]   Follow up Visit:  Please schedule this patient for Postpartum visit in: 1 week BP check, then 4wk PP visit with the following provider: Any provider For C/S patients schedule nurse incision check in weeks 2 weeks: no High risk pregnancy complicated by: Leighton Ruff Delivery mode:  VBAC Anticipated Birth Control: Nexplanon at post partum visit  PP Procedures needed: BP check, GTT  Schedule Integrated [place] visit: no   Newborn Data: Live born female  Birth Weight: 3104gm (6lb 13.5oz)   APGAR: 72, 9  Newborn Delivery   Birth date/time: 02/09/2019 07:34:00 Delivery type: VBAC, Spontaneous      Baby Feeding: Breast Disposition:home with mother   02/10/2019 Chauncey Mann, MD

## 2019-02-09 NOTE — Progress Notes (Signed)
Patient ID: Sharon Brennan, female   DOB: 12-02-1975, 43 y.o.   MRN: [id number]  Feeling ctx a little stronger; also with a mild H/A- no other pre-e symptoms  BPs 120/65, 149/84, P 77 FHR 140s, +accels, occ variables Ctx q 3-4 mins with Pit at 38mu/min Cx 4+/80/vtx -3, very anterior; AROM for pink-tinged fluid  IUP@37 .2wks cHTN TOLAC IOL process  Hopeful that with AROM her labor will increase Will try Tylenol for her H/A  Myrtis Ser CNM 02/09/2019  5:20 AM

## 2019-02-10 DIAGNOSIS — Z3A37 37 weeks gestation of pregnancy: Secondary | ICD-10-CM

## 2019-02-10 MED ORDER — AMLODIPINE BESYLATE 5 MG PO TABS
5.0000 mg | ORAL_TABLET | Freq: Every day | ORAL | Status: DC
  Administered 2019-02-10: 5 mg via ORAL
  Filled 2019-02-10: qty 1

## 2019-02-10 MED ORDER — IBUPROFEN 600 MG PO TABS: 600.0000 mg | ORAL_TABLET | Freq: Four times a day (QID) | ORAL | 0 refills | Status: AC | PRN

## 2019-02-10 MED ORDER — AMLODIPINE BESYLATE 5 MG PO TABS: 5.0000 mg | ORAL_TABLET | Freq: Every day | ORAL | 0 refills | Status: DC

## 2019-02-10 MED FILL — AMLODIPINE BESYLATE 5 MG TA: 5 | 90 days supply | Qty: 90 | Fill #0

## 2019-02-10 MED FILL — IBUPROFEN 600 MG TABLET: 600 | 23 days supply | Qty: 90 | Fill #0

## 2019-02-16 ENCOUNTER — Ambulatory Visit: Payer: Self-pay

## 2019-02-20 ENCOUNTER — Ambulatory Visit (INDEPENDENT_AMBULATORY_CARE_PROVIDER_SITE_OTHER): Payer: Self-pay | Admitting: *Deleted

## 2019-02-20 ENCOUNTER — Other Ambulatory Visit: Payer: Self-pay

## 2019-02-20 VITALS — BP 139/86 | HR 77 | Temp 98.6°F | Wt 153.4 lb

## 2019-02-20 DIAGNOSIS — O165 Unspecified maternal hypertension, complicating the puerperium: Secondary | ICD-10-CM

## 2019-02-20 DIAGNOSIS — Z0130 Encounter for examination of blood pressure without abnormal findings: Secondary | ICD-10-CM

## 2019-02-20 NOTE — Progress Notes (Signed)
   Subjective:  Sharon Brennan is a 43 y.o. female here for BP check.   Hypertension ROS: taking medications as instructed, no medication side effects noted, no TIA's, no chest pain on exertion, no dyspnea on exertion, no swelling of ankles, no orthostatic dizziness or lightheadedness, no orthopnea or paroxysmal nocturnal dyspnea, no palpitations and slight headache today due to lack of sleep per Patient. Reported being up with newborn a lot..    Objective:  BP 139/86 (BP Location: Left Arm, Patient Position: Sitting, Cuff Size: Normal)   Pulse 77   Temp 98.6 F (37 C) (Oral)   Wt 153 lb 6.4 oz (69.6 kg)   LMP 05/24/2018 (Exact Date)   BMI 30.98 kg/m   Appearance alert, well appearing, and in no distress, oriented to person, place, and time and normal appearing weight. General exam BP noted to be well controlled today in office.    Assessment:   Blood Pressure stable, reasonably well controlled and no significant medication side effects noted.   Plan:  Current treatment plan is effective, no change in therapy. Follow up: 4 weeks and as needed.Marland Kitchen

## 2019-03-06 MED FILL — LEVOTHYROXINE 50 MCG TABLET: 50 | 30 days supply | Qty: 30 | Fill #1

## 2019-03-15 ENCOUNTER — Other Ambulatory Visit: Payer: Self-pay

## 2019-03-15 ENCOUNTER — Encounter: Payer: Self-pay | Admitting: Advanced Practice Midwife

## 2019-03-15 ENCOUNTER — Ambulatory Visit (INDEPENDENT_AMBULATORY_CARE_PROVIDER_SITE_OTHER): Payer: Self-pay | Admitting: Advanced Practice Midwife

## 2019-03-15 MED ORDER — PRENATAL VITAMIN 27-0.8 MG PO TABS: 1.0000 | ORAL_TABLET | Freq: Every day | ORAL | 11 refills | Status: AC

## 2019-03-15 NOTE — Progress Notes (Signed)
TELEHEALTH POSTPARTUM VIRTUAL VIDEO VISIT ENCOUNTER NOTE   Provider location: [facility] [place]   I connected with Sharon Brennan on 03/15/19 at  1:55 PM EST by MyChart Video Encounter at home and verified that I am speaking with the correct person using two identifiers.    I discussed the limitations, risks, security and privacy concerns of performing an evaluation and management service virtually and the availability of in person appointments. I also discussed with the patient that there may be a patient responsible charge related to this service. The patient expressed understanding and agreed to proceed.  Chief Complaint: Postpartum Visit  History of Present Illness: Sharon Brennan is a 43 y.o. Hispanic [id number] being evaluated for postpartum followup.    She is s/p svd on 02/09/2019 at [redacted]w[redacted]d weeks; she was discharged to home on 02/10/2019. Pregnancy complicated by [facility], GDM. Baby is doing well.  Complains of none  Vaginal bleeding or discharge: No  Intercourse: No  Contraception: nexplanon Mode of feeding infant: Breast PP depression s/s: No .  Any bowel or bladder issues: No  Pap smear: no abnormalities (date: 2017)  Review of Systems: Her 12 point review of systems is negative or as noted in the History of Present Illness.  Patient Active Problem List   Diagnosis Date Noted  . [redacted] weeks gestation of pregnancy 02/10/2019  . Language barrier 02/08/2019  . Chronic hypertension with superimposed preeclampsia 02/08/2019  . GDM (gestational diabetes mellitus) 12/14/2018  . History of VBAC 08/25/2018  . Supervision of high risk pregnancy, antepartum 08/25/2018  . Chronic hypertension during pregnancy, antepartum 08/25/2018  . AMA (advanced maternal age) multigravida 35+ 08/25/2018  . GERD (gastroesophageal reflux disease) 10/28/2017  . Hypothyroidism affecting pregnancy 04/01/2015    Medications Tahlor Cruz-Hernandez had no medications  administered during this visit. Current Outpatient Medications  Medication Sig Dispense Refill  . Acetaminophen (TYLENOL) 325 MG CAPS Take 1 tablet by mouth as needed.     Marland Kitchen amLODipine (NORVASC) 5 MG tablet Take 1 tablet (5 mg total) by mouth daily. 90 tablet 0  . ibuprofen (ADVIL) 600 MG tablet Take 1 tablet (600 mg total) by mouth every 6 (six) hours as needed. 90 tablet 0  . levothyroxine (SYNTHROID) 50 MCG tablet Take 1 tablet (50 mcg total) by mouth daily before breakfast. Reported on 04/01/2015 60 tablet 1  . Prenatal Vit-Fe Fumarate-FA (PRENATAL VITAMIN PO) Take by mouth.     No current facility-administered medications for this visit.     Allergies Pollen extract  Physical Exam:  LMP 05/24/2018 (Exact Date)  BP taken while on video with me: 130/86   General:  Alert, oriented and cooperative. Patient is in no acute distress.  Mental Status: Normal mood and affect. Normal behavior. Normal judgment and thought content.   Respiratory: Normal respiratory effort noted, no problems with respiration noted  Rest of physical exam deferred due to type of encounter  PP Depression Screening:   Edinburgh Postnatal Depression Scale Screening Tool 02/09/2019 02/09/2019  I have been able to laugh and see the funny side of things. 0 (No Data)  I have looked forward with enjoyment to things. 0 -  I have blamed myself unnecessarily when things went wrong. 0 -  I have been anxious or worried for no good reason. 0 -  I have felt scared or panicky for no good reason. 0 -  Things have been getting on top of me. 0 -  I have been so unhappy  that I have had difficulty sleeping. 0 -  I have felt sad or miserable. 0 -  I have been so unhappy that I have been crying. 0 -  The thought of harming myself has occurred to me. 0 -  Edinburgh Postnatal Depression Scale Total 0 -     Assessment:Patient is a 43 y.o. [id number] who is 5 weeks postpartum from a normal spontaneous vaginal delivery.  She is doing  well.   Plan: 1. Postpartum care and examination    - Has appt with HD for pap and Nexplanon placement - Has appt with [facility] for BP management.  - Appt 03/23/2019 for 2 hour GTT 2/2 GDM with this pregnancy  RTC PRN  I discussed the assessment and treatment plan with the patient. The patient was provided an opportunity to ask questions and all were answered. The patient agreed with the plan and demonstrated an understanding of the instructions.   The patient was advised to call back or seek an in-person evaluation/go to the ED for any concerning postpartum symptoms.  I provided 15 minutes of face-to-face time during this encounter. MyChart video interpretor used for this encounter    Marcille Buffy DNP, CNM  03/15/19  2:01 PM  [facility] [place]

## 2019-03-23 ENCOUNTER — Other Ambulatory Visit: Payer: Self-pay | Admitting: *Deleted

## 2019-03-23 ENCOUNTER — Other Ambulatory Visit: Payer: Self-pay

## 2019-03-23 DIAGNOSIS — O24429 Gestational diabetes mellitus in childbirth, unspecified control: Secondary | ICD-10-CM

## 2019-04-11 MED FILL — LEVOTHYROXINE 50 MCG TABLET: 50 | 30 days supply | Qty: 30 | Fill #2

## 2019-05-02 ENCOUNTER — Ambulatory Visit: Payer: Self-pay | Admitting: Family Medicine

## 2019-05-18 MED FILL — LEVOTHYROXINE 50 MCG TABLET: 50 | 30 days supply | Qty: 30 | Fill #3

## 2019-06-14 ENCOUNTER — Other Ambulatory Visit: Payer: Self-pay

## 2019-06-14 ENCOUNTER — Encounter: Payer: Self-pay | Admitting: Family Medicine

## 2019-06-14 ENCOUNTER — Ambulatory Visit: Payer: Self-pay | Attending: Family Medicine | Admitting: Family Medicine

## 2019-06-14 VITALS — BP 122/80 | HR 100 | Ht 59.0 in | Wt 146.0 lb

## 2019-06-14 DIAGNOSIS — I10 Essential (primary) hypertension: Secondary | ICD-10-CM

## 2019-06-14 DIAGNOSIS — E038 Other specified hypothyroidism: Secondary | ICD-10-CM

## 2019-06-14 DIAGNOSIS — Z131 Encounter for screening for diabetes mellitus: Secondary | ICD-10-CM

## 2019-06-14 LAB — POCT GLYCOSYLATED HEMOGLOBIN (HGB A1C): HbA1c, POC (controlled diabetic range): 5.5 % (ref 0.0–7.0)

## 2019-06-14 MED ORDER — LABETALOL HCL 100 MG PO TABS: 100.0000 mg | ORAL_TABLET | Freq: Two times a day (BID) | ORAL | 6 refills | Status: DC

## 2019-06-14 MED FILL — LABETALOL HCL 100 MG TABS: 100 | 30 days supply | Qty: 60 | Fill #0

## 2019-06-14 NOTE — Progress Notes (Signed)
Subjective:  Patient ID: Sharon Brennan, female    DOB: May 18, 1975  Age: 44 y.o. MRN: [id number]  CC: Hypothyroidism   HPI Sharon Brennan is a 44 year old female with a history of hypertension, hypothyroidism who presents today for a follow-up visit.  She is 4 months post partum  Post partum visit with GYN was in 03/2019.  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  Endorses compliance with levothyroxine for hypothyroidism. She has no additional concerns today.  Past Medical History:  Diagnosis Date  . Gestational diabetes   . Hypertension   . Hypothyroidism     Past Surgical History:  Procedure Laterality Date  . CESAREAN SECTION    . CESAREAN SECTION      Family History  Problem Relation Age of Onset  . Hypertension Mother     Allergies  Allergen Reactions  . Pollen Extract     Outpatient Medications Prior to Visit  Medication Sig Dispense Refill  . amLODipine (NORVASC) 5 MG tablet Take 1 tablet (5 mg total) by mouth daily. 90 tablet 0  . levothyroxine (SYNTHROID) 50 MCG tablet Take 1 tablet (50 mcg total) by mouth daily before breakfast. Reported on 04/01/2015 60 tablet 1  . Acetaminophen (TYLENOL) 325 MG CAPS Take 1 tablet by mouth as needed.     Marland Kitchen ibuprofen (ADVIL) 600 MG tablet Take 1 tablet (600 mg total) by mouth every 6 (six) hours as needed. (Patient not taking: Reported on 06/14/2019) 90 tablet 0  . Prenatal Vit-Fe Fumarate-FA (PRENATAL VITAMIN) 27-0.8 MG TABS Take 1 tablet by mouth daily. (Patient not taking: Reported on 06/14/2019) 30 tablet 11   No facility-administered medications prior to visit.     ROS Review of Systems  Constitutional: Negative for activity change, appetite change and fatigue.  HENT: Negative for congestion, sinus pressure and sore throat.   Eyes: Negative for visual disturbance.  Respiratory: Negative for cough, chest tightness, shortness of breath  and wheezing.   Cardiovascular: Negative for chest pain and palpitations.  Gastrointestinal: Negative for abdominal distention, abdominal pain and constipation.  Endocrine: Negative for polydipsia.  Genitourinary: Negative for dysuria and frequency.  Musculoskeletal: Negative for arthralgias and back pain.  Skin: Negative for rash.  Neurological: Negative for tremors, light-headedness and numbness.  Hematological: Does not bruise/bleed easily.  Psychiatric/Behavioral: Negative for agitation and behavioral problems.    Objective:  BP 122/80   Pulse 100   Ht 4\' 11"  (1.499 m)   Wt 146 lb (66.2 kg)   SpO2 100%   BMI 29.49 kg/m   BP/Weight 06/14/2019 02/20/2019 02/10/2019  Systolic BP 122 139 118  Diastolic BP 80 86 76  Wt. (Lbs) 146 153.4 -  BMI 29.49 30.98 -      Physical Exam Constitutional:      Appearance: She is well-developed.  Neck:     Vascular: No JVD.  Cardiovascular:     Rate and Rhythm: Normal rate.     Heart sounds: Normal heart sounds. No murmur.  Pulmonary:     Effort: Pulmonary effort is normal.     Breath sounds: Normal breath sounds. No wheezing or rales.  Chest:     Chest wall: No tenderness.  Abdominal:     General: Bowel sounds are normal. There is no distension.     Palpations: Abdomen is soft. There is no mass.     Tenderness: There is no abdominal tenderness.  Musculoskeletal:  General: Normal range of motion.     Right lower leg: No edema.     Left lower leg: No edema.  Neurological:     Mental Status: She is alert and oriented to person, place, and time.  Psychiatric:        Mood and Affect: Mood normal.     CMP Latest Ref Rng & Units 02/08/2019 08/25/2018 05/30/2018  Glucose 70 - 99 mg/dL 87 87 100(H)  BUN 6 - 20 mg/dL 16 7 13   Creatinine 0.44 - 1.00 mg/dL 0.39(L) 0.40(L) 0.61  Sodium 135 - 145 mmol/L 135 136 140  Potassium 3.5 - 5.1 mmol/L 4.0 3.8 4.4  Chloride 98 - 111 mmol/L 106 102 98  CO2 22 - 32 mmol/L 20(L) 21 25    Calcium 8.9 - 10.3 mg/dL 9.0 9.8 9.7  Total Protein 6.5 - 8.1 g/dL 6.8 6.9 7.8  Total Bilirubin 0.3 - 1.2 mg/dL 0.1(L) <0.2 0.3  Alkaline Phos 38 - 126 U/L 190(H) 76 105  AST 15 - 41 U/L 24 16 11   ALT 0 - 44 U/L 22 13 15     Lipid Panel     Component Value Date/Time   CHOL 187 05/30/2018 1039   TRIG 143 05/30/2018 1039   HDL 71 05/30/2018 1039   CHOLHDL 2.6 05/30/2018 1039   CHOLHDL 3.0 03/04/2016 0939   VLDL 19 03/04/2016 0939   LDLCALC 87 05/30/2018 1039    CBC    Component Value Date/Time   WBC 8.3 02/08/2019 1414   RBC 4.43 02/08/2019 1414   HGB 13.6 02/08/2019 1414   HGB 12.1 12/13/2018 0911   HGB 13.2 08/08/2018 0000   HCT 40.7 02/08/2019 1414   HCT 35.9 12/13/2018 0911   HCT 41 08/08/2018 0000   PLT 177 02/08/2019 1414   PLT 239 12/13/2018 0911   PLT 261 08/08/2018 0000   MCV 91.9 02/08/2019 1414   MCV 90 12/13/2018 0911   MCH 30.7 02/08/2019 1414   MCHC 33.4 02/08/2019 1414   RDW 13.4 02/08/2019 1414   RDW 13.0 12/13/2018 0911   LYMPHSABS 1.2 11/19/2007 0500   MONOABS 0.3 11/19/2007 0500   EOSABS 0.0 11/19/2007 0500   BASOSABS 0.0 11/19/2007 0500    Lab Results  Component Value Date   HGBA1C 5.90 05/01/2015    Lab Results  Component Value Date   TSH 3.290 12/13/2018    Assessment & Plan:  1. Other specified hypothyroidism Controlled We will send of TSH and adjust regimen accordingly - T4, free  2. Essential hypertension Controlled Switched from amlodipine to labetalol as the latter is safer in lactation - labetalol (NORMODYNE) 100 MG tablet; Take 1 tablet (100 mg total) by mouth 2 (two) times daily.  Dispense: 60 tablet; Refill: 6 - Basic Metabolic Panel - Lipid panel  3. Postpartum care following vaginal delivery - CBC with Differential/Platelet  4. Screening for diabetes mellitus Negative for diabetes mellitus - POCT glycosylated hemoglobin (Hb A1C)   Return in about 6 months (around 12/15/2019) for Chronic medical conditions-in  person.      [name], MD, FAAFP. [facility] [place], [place]   06/14/2019, 10:48 AM

## 2019-06-14 NOTE — Patient Instructions (Signed)

## 2019-06-15 ENCOUNTER — Other Ambulatory Visit: Payer: Self-pay | Admitting: Family Medicine

## 2019-06-15 DIAGNOSIS — E038 Other specified hypothyroidism: Secondary | ICD-10-CM

## 2019-06-15 LAB — BASIC METABOLIC PANEL
BUN/Creatinine Ratio: 26 — ABNORMAL HIGH (ref 9–23)
BUN: 16 mg/dL (ref 6–24)
CO2: 20 mmol/L (ref 20–29)
Calcium: 9.9 mg/dL (ref 8.7–10.2)
Chloride: 101 mmol/L (ref 96–106)
Creatinine, Ser: 0.62 mg/dL (ref 0.57–1.00)
GFR calc Af Amer: 128 mL/min/{1.73_m2} (ref >59)
GFR calc non Af Amer: 111 mL/min/{1.73_m2} (ref >59)
Glucose: 112 mg/dL — ABNORMAL HIGH (ref 65–99)
Potassium: 4.2 mmol/L (ref 3.5–5.2)
Sodium: 140 mmol/L (ref 134–144)

## 2019-06-15 LAB — CBC WITH DIFFERENTIAL/PLATELET
Basophils Absolute: 0 10*3/uL (ref 0.0–0.2)
Basos: 1 %
EOS (ABSOLUTE): 0 10*3/uL (ref 0.0–0.4)
Eos: 1 %
Hematocrit: 46 % (ref 34.0–46.6)
Hemoglobin: 15.5 g/dL (ref 11.1–15.9)
Immature Grans (Abs): 0 10*3/uL (ref 0.0–0.1)
Immature Granulocytes: 0 %
Lymphocytes Absolute: 2.3 10*3/uL (ref 0.7–3.1)
Lymphs: 30 %
MCH: 30.5 pg (ref 26.6–33.0)
MCHC: 33.7 g/dL (ref 31.5–35.7)
MCV: 91 fL (ref 79–97)
Monocytes Absolute: 0.4 10*3/uL (ref 0.1–0.9)
Monocytes: 6 %
Neutrophils Absolute: 4.8 10*3/uL (ref 1.4–7.0)
Neutrophils: 62 %
Platelets: 235 10*3/uL (ref 150–450)
RBC: 5.08 x10E6/uL (ref 3.77–5.28)
RDW: 13.1 % (ref 11.7–15.4)
WBC: 7.6 10*3/uL (ref 3.4–10.8)

## 2019-06-15 LAB — LIPID PANEL
Chol/HDL Ratio: 2.4 ratio (ref 0.0–4.4)
Cholesterol, Total: 167 mg/dL (ref 100–199)
HDL: 70 mg/dL (ref >39)
LDL Chol Calc (NIH): 87 mg/dL (ref 0–99)
Triglycerides: 47 mg/dL (ref 0–149)
VLDL Cholesterol Cal: 10 mg/dL (ref 5–40)

## 2019-06-15 LAB — T4, FREE: Free T4: 1.59 ng/dL (ref 0.82–1.77)

## 2019-06-15 MED ORDER — LEVOTHYROXINE SODIUM 50 MCG PO TABS: 50.0000 ug | ORAL_TABLET | Freq: Every day | ORAL | 6 refills | Status: DC

## 2019-06-15 MED FILL — LEVOTHYROXINE 50 MCG TABLET: 50 | 30 days supply | Qty: 30 | Fill #0

## 2019-06-16 ENCOUNTER — Telehealth: Payer: Self-pay

## 2019-06-16 NOTE — Telephone Encounter (Signed)
Patient does not have a voicemail set up to leave a message. 

## 2019-06-16 NOTE — Telephone Encounter (Signed)
-----   Message from Hoy Register, MD sent at 06/15/2019 12:56 PM EST ----- Please inform her cholesterol is normal, thyroid levels are normal, liver and kidney functions are normal.

## 2019-06-17 LAB — TSH: TSH: 2.16 u[IU]/mL (ref 0.450–4.500)

## 2019-06-17 LAB — SPECIMEN STATUS REPORT

## 2019-06-23 ENCOUNTER — Telehealth: Payer: Self-pay

## 2019-06-23 NOTE — Telephone Encounter (Signed)
Patient has a VM that has not been set up yet.

## 2019-06-23 NOTE — Telephone Encounter (Signed)
-----   Message from Enobong Newlin, MD sent at 06/15/2019 12:56 PM EST ----- Please inform her cholesterol is normal, thyroid levels are normal, liver and kidney functions are normal. 

## 2019-06-29 ENCOUNTER — Ambulatory Visit: Payer: Self-pay | Attending: Family | Admitting: Family

## 2019-06-29 ENCOUNTER — Encounter: Payer: Self-pay | Admitting: Family

## 2019-06-29 ENCOUNTER — Other Ambulatory Visit: Payer: Self-pay

## 2019-06-29 ENCOUNTER — Ambulatory Visit: Payer: Self-pay

## 2019-06-29 VITALS — BP 154/90 | HR 77 | Wt 146.8 lb

## 2019-06-29 DIAGNOSIS — H60503 Unspecified acute noninfective otitis externa, bilateral: Secondary | ICD-10-CM

## 2019-06-29 MED ORDER — AMOXICILLIN-POT CLAVULANATE 875-125 MG PO TABS: 1.0000 | ORAL_TABLET | Freq: Two times a day (BID) | ORAL | 0 refills | Status: AC

## 2019-06-29 MED FILL — AMOX-CLAV 875-125 MG TABLET: 875-125 | 5 days supply | Qty: 10 | Fill #0

## 2019-06-29 MED FILL — LEVOTHYROXINE 50 MCG TABLET: 50 | 30 days supply | Qty: 30 | Fill #0

## 2019-06-29 NOTE — Patient Instructions (Addendum)
Augmentin for ear infection. Follow-up if symptoms do not resolve. Dolor de [employer] adultos [place], Adult Un dolor de odo puede deberse a muchas causas, que incluyen lo siguiente:  Una infeccin.  Acumulacin de cerumen.  Presin en el odo.  Algo en el odo que no debera estar ah (cuerpo extrao).  Dolor de Investment banker, operational.  Problemas dentales.  Problemas en la mandbula. El tratamiento del dolor de odo depender de la causa. Si la causa no est clara o no se Lexicographer, deber observar los sntomas hasta que el dolor de odo desaparezca o hasta que se descubra la causa. Siga estas instrucciones en su casa: Medicamentos  Tome o aplquese los medicamentos de venta libre y los recetados solamente como se lo haya indicado el mdico.  Si le recetaron un antibitico, selo como se lo haya indicado el mdico. No deje de usar el antibitico aunque comience a Sports administrator.  No se ponga nada en el odo que no sean los medicamentos que Special educational needs teacher. Control del dolor Si se lo indican, aplique calor en la zona afectada con la frecuencia que le haya indicado el mdico. Use la fuente de calor que el mdico le recomiende, como una compresa de calor hmedo o una almohadilla trmica.  Coloque una toalla entre la piel y la fuente de Freight forwarder.  Aplique calor durante 20 a 102minutos.  Retire la fuente de calor si la piel se pone de color rojo brillante. Esto es especialmente importante si no puede sentir dolor, calor o fro. Puede correr un riesgo mayor de sufrir quemaduras. Si se lo indican, aplique hielo en la zona afectada con la frecuencia que le haya indicado el mdico. Para hacer esto:      Ponga el hielo en una bolsa plstica.  Coloque una toalla entre la piel y Therapist, nutritional.  Coloque el hielo durante 36minutos, 2 a 3veces por Training and development officer. Instrucciones generales  Est atenta a cualquier cambio en los sntomas.  Intente descansar en posicin erguida, en lugar de recostarse.  Esto puede ayudar a reducir la presin en el odo y Best boy.  Mastique goma de mascar si esto ayuda a Best boy de odos.  Trate cualquier [employer] se lo haya indicado el mdico.  Beba suficiente lquido como para Theatre manager la orina de color amarillo plido.  Es su responsabilidad [employer] de cualquier prueba que se haya realizado. Consulte al mdico o pregunte en el departamento donde se realizan las pruebas cundo estarn [employer].  Concurra a todas las visitas de seguimiento como se lo haya indicado el mdico. Esto es importante. Comunquese con un mdico si:  El dolor no mejora en el trmino de 2das.  El dolor de odo [place].  Aparecen nuevos sntomas.  Tiene fiebre. Busque ayuda de inmediato si:  Tiene un dolor de cabeza intenso.  Tiene rigidez en el cuello.  Tiene dificultad para tragar.  Tiene enrojecimiento o hinchazn detrs de Investment banker, operational.  Le sale sangre o lquido del odo.  Pierde la audicin.  Se siente mareado. Resumen  El dolor de odo puede deberse a muchas causas.  El tratamiento del dolor de odo depender de la causa. Siga las recomendaciones del mdico para tratar su dolor de odo.  Si la causa no est clara o no se Lexicographer, deber observar los sntomas hasta que el dolor de odo desaparezca o hasta que se descubra la causa.  Concurra a todas las visitas de seguimiento como se  lo haya indicado el mdico. Esto es importante. Esta informacin no tiene Theme park manager el consejo del mdico. Asegrese de hacerle al mdico cualquier pregunta que tenga. Document Revised: 12/15/2018 Document Reviewed: 12/15/2018 Elsevier Patient Education  2020 [employer].

## 2019-06-29 NOTE — Progress Notes (Signed)
Patient ID: Sharon Brennan, female    DOB: July 12, 1975  MRN: [id number]  CC: Bilateral ear pain  Subjective: Sharon Brennan is a 44 y.o. female with history of chronic hypertension with superimposed preeclampsia, hypothyroidism affecting pregnancy, gestational diabetes mellitus, history of VBAC, and GERD, who presents for bilateral ear pain. [address] Marshell Garfinkel HA#[id number], participated during this visit.    1. BILATERAL EAR PAIN: Location: both Rating: 1-7/10 reports cant describe it but pain comes and goes and fluctuates from 1-7 Onset: 7 days ago Modifying factors: Loud noises are an irritant. Nothing makes better. Has not tried any over-the-counter medications. Reports when she wakes up in the morning she feels fine. Pain usually onsets around 2:00 pm to 6:00 pm and becomes worse at night. Reports bilateral ears felt clogged over the weekend but they do not feel this way today. Had ear problems as a child but cant recall what they were. Similar problem in 2018 of which she was treated with ear drops. Has been cleaning both ears with Q-tips recently. Patient breastfeeding.    Symptoms  Sensation of fullness: Denies Ear discharge: small amount yellow drainage from left ear only URI symptoms:  Denies Cough: Denies Fever: Denies  Tinnitus: Denies Dizziness: Yes on yesterday but none since then.  Hearing loss: Denies Toothache: Denies Rashes or lesions: Denies  Facial muscle weakness: Denies  Red Flags Recent trauma: Denies PMH prior ear surgery: Denies Diabetes or Immunosuppresion: Diabetes with pregnancy  Patient Active Problem List   Diagnosis Date Noted  . [redacted] weeks gestation of pregnancy 02/10/2019  . Language barrier 02/08/2019  . Chronic hypertension with superimposed preeclampsia 02/08/2019  . GDM (gestational diabetes mellitus) 12/14/2018  . History of VBAC 08/25/2018  . Supervision of high risk pregnancy, antepartum 08/25/2018  . Chronic hypertension  during pregnancy, antepartum 08/25/2018  . AMA (advanced maternal age) multigravida 35+ 08/25/2018  . GERD (gastroesophageal reflux disease) 10/28/2017  . Hypothyroidism affecting pregnancy 04/01/2015     Current Outpatient Medications on File Prior to Visit  Medication Sig Dispense Refill  . Acetaminophen (TYLENOL) 325 MG CAPS Take 1 tablet by mouth as needed.     Marland Kitchen ibuprofen (ADVIL) 600 MG tablet Take 1 tablet (600 mg total) by mouth every 6 (six) hours as needed. (Patient not taking: Reported on 06/14/2019) 90 tablet 0  . labetalol (NORMODYNE) 100 MG tablet Take 1 tablet (100 mg total) by mouth 2 (two) times daily. 60 tablet 6  . levothyroxine (SYNTHROID) 50 MCG tablet Take 1 tablet (50 mcg total) by mouth daily before breakfast. 30 tablet 6  . Prenatal Vit-Fe Fumarate-FA (PRENATAL VITAMIN) 27-0.8 MG TABS Take 1 tablet by mouth daily. (Patient not taking: Reported on 06/14/2019) 30 tablet 11   No current facility-administered medications on file prior to visit.    Allergies  Allergen Reactions  . Pollen Extract     Social History   Socioeconomic History  . Marital status: Married    Spouse name: Armed forces training and education officer  . Number of children: 3  . Years of education: Not on file  . Highest education level: Not on file  Occupational History  . Occupation: unemployed  Tobacco Use  . Smoking status: Never Smoker  . Smokeless tobacco: Never Used  Substance and Sexual Activity  . Alcohol use: No  . Drug use: No  . Sexual activity: Yes    Birth control/protection: None  Other Topics Concern  . Not on file  Social History Narrative   ** Merged History Encounter **  Social Determinants of Health   Financial Resource Strain: Low Risk   . Difficulty of Paying Living Expenses: Not hard at all  Food Insecurity: No Food Insecurity  . Worried About Charity fundraiser in the Last Year: Never true  . Ran Out of Food in the Last Year: Never true  Transportation Needs: No  Transportation Needs  . Lack of Transportation (Medical): No  . Lack of Transportation (Non-Medical): No  Physical Activity: Insufficiently Active  . Days of Exercise per Week: 3 days  . Minutes of Exercise per Session: 30 min  Stress: No Stress Concern Present  . Feeling of Stress : Not at all  Social Connections: Not Isolated  . Frequency of Communication with Friends and Family: More than three times a week  . Frequency of Social Gatherings with Friends and Family: Three times a week  . Attends Religious Services: More than 4 times per year  . Active Member of Clubs or Organizations: Not on file  . Attends Archivist Meetings: 1 to 4 times per year  . Marital Status: Married  Human resources officer Violence:   . Fear of Current or Ex-Partner:   . Emotionally Abused:   Marland Kitchen Physically Abused:   . Sexually Abused:     Family History  Problem Relation Age of Onset  . Hypertension Mother     Past Surgical History:  Procedure Laterality Date  . CESAREAN SECTION    . CESAREAN SECTION      ROS: Review of Systems Negative except as stated above  PHYSICAL EXAM: Vitals with BMI 06/29/2019 06/14/2019 02/20/2019  Height - 4\' 11"  -  Weight 146 lbs 13 oz 146 lbs -  BMI 89.21 19.41 -  Systolic 740 814 481  Diastolic 90 80 86  Pulse 77 100 77  SpO2- 98%, room air  Physical Exam General appearance - alert, well appearing, and in no distress and oriented to person, place, and time Mental status - alert, oriented to person, place, and time, normal mood, behavior, speech, dress, motor activity, and thought processes Eyes - pupils equal and reactive, extraocular eye movements intact, funduscopic exam normal, discs flat and sharp Ears - bilateral TM's and external ear canals normal, bilateral ears tender upon palpation Nose - normal and patent, no erythema, discharge or polyps and sinuses normal and non-tender Mouth - mucous membranes moist, pharynx normal without lesions Neck -  supple, no significant adenopathy Lymphatics - no palpable lymphadenopathy, no hepatosplenomegaly Chest - clear to auscultation, no wheezes, rales or rhonchi, symmetric air entry, no tachypnea, retractions or cyanosis Heart - normal rate, regular rhythm, normal S1, S2, no murmurs, rubs, clicks or gallops Neurological - alert, oriented, normal speech, no focal findings or movement disorder noted, neck supple without rigidity, cranial nerves II through XII intact, funduscopic exam normal, discs flat and sharp, DTR's normal and symmetric, motor and sensory grossly normal bilaterally, normal muscle tone, no tremors, strength 5/5, Romberg sign negative, normal gait and station   CMP Latest Ref Rng & Units 06/14/2019 02/08/2019 08/25/2018  Glucose 65 - 99 mg/dL 112(H) 87 87  BUN 6 - 24 mg/dL 16 16 7   Creatinine 0.57 - 1.00 mg/dL 0.62 0.39(L) 0.40(L)  Sodium 134 - 144 mmol/L 140 135 136  Potassium 3.5 - 5.2 mmol/L 4.2 4.0 3.8  Chloride 96 - 106 mmol/L 101 106 102  CO2 20 - 29 mmol/L 20 20(L) 21  Calcium 8.7 - 10.2 mg/dL 9.9 9.0 9.8  Total Protein 6.5 - 8.1  g/dL - 6.8 6.9  Total Bilirubin 0.3 - 1.2 mg/dL - 9.3(T) <7.0  Alkaline Phos 38 - 126 U/L - 190(H) 76  AST 15 - 41 U/L - 24 16  ALT 0 - 44 U/L - 22 13   Lipid Panel     Component Value Date/Time   CHOL 167 06/14/2019 1126   TRIG 47 06/14/2019 1126   HDL 70 06/14/2019 1126   CHOLHDL 2.4 06/14/2019 1126   CHOLHDL 3.0 03/04/2016 0939   VLDL 19 03/04/2016 0939   LDLCALC 87 06/14/2019 1126    CBC    Component Value Date/Time   WBC 7.6 06/14/2019 1126   WBC 8.3 02/08/2019 1414   RBC 5.08 06/14/2019 1126   RBC 4.43 02/08/2019 1414   HGB 15.5 06/14/2019 1126   HGB 13.2 08/08/2018 0000   HCT 46.0 06/14/2019 1126   HCT 41 08/08/2018 0000   PLT 235 06/14/2019 1126   PLT 261 08/08/2018 0000   MCV 91 06/14/2019 1126   MCH 30.5 06/14/2019 1126   MCH 30.7 02/08/2019 1414   MCHC 33.7 06/14/2019 1126   MCHC 33.4 02/08/2019 1414   RDW 13.1  06/14/2019 1126   LYMPHSABS 2.3 06/14/2019 1126   MONOABS 0.3 11/19/2007 0500   EOSABS 0.0 06/14/2019 1126   BASOSABS 0.0 06/14/2019 1126    ASSESSMENT AND PLAN: 1. Acute otitis externa of both ears, unspecified type: - amoxicillin-clavulanate (AUGMENTIN) 875-125 MG tablet; Take 1 tablet by mouth 2 (two) times daily for 5 days.  Dispense: 10 tablet; Refill: 0 -Will begin course of amoxicillin-clavulanate for management of acute otitis externa. -Amoxicillin-clavulanate is safe during lactation. Consulted with clinical pharmacist Dr. Lois Huxley for confirmation. Also, discussed with patient potential GI disturbances or diarrhea that baby may develop by consuming breast milk and to monitor for these symptoms. Patient agreeable. -Counseled on importance of not sticking anything in her ear such as hair pins and Q-tips which may cause damage to ear.  -If symptoms of otitis externa do not resolve or worsen after completion of antibiotics patient should schedule an appointment for follow-up for possible additional course of antibiotics or referral to Ear Nose Throat specialist.   Patient was given the opportunity to ask questions.  Patient verbalized understanding of the plan and was able to repeat key elements of the plan. Patient was given clear instructions to go to Emergency Department or return to medical center if symptoms don't improve, worsen, or new problems develop.The patient verbalized understanding.    Requested Prescriptions    No prescriptions requested or ordered in this encounter    Merridith Dershem Jodi Geralds, NP

## 2019-08-14 MED FILL — LEVOTHYROXINE SODIUM 50 MCG: 50 | 30 days supply | Qty: 30 | Fill #1

## 2019-09-05 ENCOUNTER — Other Ambulatory Visit: Payer: Self-pay

## 2019-09-05 ENCOUNTER — Encounter: Payer: Self-pay | Admitting: Family

## 2019-09-05 ENCOUNTER — Ambulatory Visit: Payer: Self-pay | Attending: Family | Admitting: Family

## 2019-09-05 VITALS — BP 152/96 | HR 99 | Temp 98.8°F | Resp 16 | Wt 150.8 lb

## 2019-09-05 DIAGNOSIS — H9203 Otalgia, bilateral: Secondary | ICD-10-CM

## 2019-09-05 DIAGNOSIS — I10 Essential (primary) hypertension: Secondary | ICD-10-CM

## 2019-09-05 DIAGNOSIS — Z789 Other specified health status: Secondary | ICD-10-CM

## 2019-09-05 MED ORDER — AMOXICILLIN-POT CLAVULANATE 875-125 MG PO TABS: 1.0000 | ORAL_TABLET | Freq: Two times a day (BID) | ORAL | 0 refills | Status: DC

## 2019-09-05 MED FILL — AMOX-CLAV 875-125 MG TABLET: 875-125 | 5 days supply | Qty: 10 | Fill #0

## 2019-09-05 NOTE — Patient Instructions (Addendum)
Amoxicillin-Clavulanate for ear pain. Referral to ENT. Follow-up with primary physician as needed. Dolor de [employer] adultos [place], Adult Un dolor de odo puede deberse a muchas causas, que incluyen lo siguiente:  Una infeccin.  Acumulacin de cerumen.  Presin en el odo.  Algo en el odo que no debera estar ah (cuerpo extrao).  Dolor de Advertising copywriter.  Problemas dentales.  Problemas en la mandbula. El tratamiento del dolor de odo depender de la causa. Si la causa no est clara o no se Counselling psychologist, deber observar los sntomas hasta que el dolor de odo desaparezca o hasta que se descubra la causa. Siga estas instrucciones en su casa: Medicamentos  Tome o aplquese los medicamentos de venta libre y los recetados solamente como se lo haya indicado el mdico.  Si le recetaron un antibitico, selo como se lo haya indicado el mdico. No deje de usar el antibitico aunque comience a Actor.  No se ponga nada en el odo que no sean los medicamentos que Glass blower/designer. Control del dolor Si se lo indican, aplique calor en la zona afectada con la frecuencia que le haya indicado el mdico. Use la fuente de calor que el mdico le recomiende, como una compresa de calor hmedo o una almohadilla trmica.  Coloque una toalla entre la piel y la fuente de Airline pilot.  Aplique calor durante 20 a .  Retire la fuente de calor si la piel se pone de color rojo brillante. Esto es especialmente importante si no puede sentir dolor, calor o fro. Puede correr un riesgo mayor de sufrir quemaduras. Si se lo indican, aplique hielo en la zona afectada con la frecuencia que le haya indicado el mdico. Para hacer esto:      Ponga el hielo en una bolsa plstica.  Coloque una toalla entre la piel y Copy.  Coloque el hielo durante , 2 a 3veces por Futures trader. Instrucciones generales  Est atenta a cualquier cambio en los sntomas.  Intente descansar en posicin  erguida, en lugar de recostarse. Esto puede ayudar a reducir la presin en el odo y Engineer, materials.  Mastique goma de mascar si esto ayuda a Engineer, materials de odos.  Trate cualquier [employer] se lo haya indicado el mdico.  Beba suficiente lquido como para Pharmacologist la orina de color amarillo plido.  Es su responsabilidad [employer] de cualquier prueba que se haya realizado. Consulte al mdico o pregunte en el departamento donde se realizan las pruebas cundo estarn [employer].  Concurra a todas las visitas de [address] se lo haya indicado el mdico. Esto es importante. Comunquese con un mdico si:  El dolor no mejora en el trmino de 2das.  El dolor de odo [place].  Aparecen nuevos sntomas.  Tiene fiebre. Busque ayuda de inmediato si:  Tiene un dolor de cabeza intenso.  Tiene rigidez en el cuello.  Tiene dificultad para tragar.  Tiene enrojecimiento o hinchazn detrs de Museum/gallery conservator.  Le sale sangre o lquido del odo.  Pierde la audicin.  Se siente mareado. Resumen  El dolor de odo puede deberse a muchas causas.  El tratamiento del dolor de odo depender de la causa. Siga las recomendaciones del mdico para tratar su dolor de odo.  Si la causa no est clara o no se Counselling psychologist, deber observar los sntomas hasta que el dolor de odo desaparezca o hasta que se descubra la causa.  Concurra a todas las visitas de  seguimiento como se lo haya indicado el mdico. Esto es importante. Esta informacin no tiene Marine scientist el consejo del mdico. Asegrese de hacerle al mdico cualquier pregunta que tenga. Document Revised: 12/15/2018 Document Reviewed: 12/15/2018 Elsevier Patient Education  [place].

## 2019-09-05 NOTE — Progress Notes (Addendum)
Patient ID: Sharon Brennan, female    DOB: June 22, 1975  MRN: [id number]  CC: Ear pain   Subjective: Sharon Brennan is a 44 y.o. female with history of GERD who presents for ear pain.  1. EAR PAIN: Left ear pain began 14 days ago and right ear pain began 11 days ago. Reports pain comes and goes and ranges 1-5 out of 10 on pain scale. Reports ear pain alternates from being worse in the morning and sometimes worse in the evening. Reports sometimes ear pain severe that it prevents her from functioning as normal.      Modifying factors:  Makes worse: music or sounds Makes better: Advil helps   Symptoms  Sensation of fullness: Yes, sometimes feels like she is deaf. Reports sometimes she looses hearing briefly and then hearing comes back normal. Ear discharge: denies URI symptoms:  Denies Fever: Denies Tinnitus:  Yes  Dizziness:  Yes Headaches: Yes Hearing loss:  Yes Popping/clicking sounds: Denies  Sensation of foreign body in ear: Denies Toothache: Denies Rashes or lesions: Denies Facial muscle weakness: Denies Burning sensation: Denies  External ears painful/hurting/tender: Denies  Red Flags Recent trauma: Denies  PMH prior ear surgery: Denies Comments: Last visit 06/29/2019 with me. During that encounter patient treated for acute otitis externa of both ears with Augmentin.    Patient Active Problem List   Diagnosis Date Noted  . [redacted] weeks gestation of pregnancy 02/10/2019  . Language barrier 02/08/2019  . Chronic hypertension with superimposed preeclampsia 02/08/2019  . GDM (gestational diabetes mellitus) 12/14/2018  . History of VBAC 08/25/2018  . Supervision of high risk pregnancy, antepartum 08/25/2018  . Chronic hypertension during pregnancy, antepartum 08/25/2018  . AMA (advanced maternal age) multigravida 35+ 08/25/2018  . GERD (gastroesophageal reflux disease) 10/28/2017  . Hypothyroidism affecting pregnancy 04/01/2015     Current Outpatient  Medications on File Prior to Visit  Medication Sig Dispense Refill  . Acetaminophen (TYLENOL) 325 MG CAPS Take 1 tablet by mouth as needed.     Marland Kitchen ibuprofen (ADVIL) 600 MG tablet Take 1 tablet (600 mg total) by mouth every 6 (six) hours as needed. (Patient not taking: Reported on 06/14/2019) 90 tablet 0  . labetalol (NORMODYNE) 100 MG tablet Take 1 tablet (100 mg total) by mouth 2 (two) times daily. 60 tablet 6  . levothyroxine (SYNTHROID) 50 MCG tablet Take 1 tablet (50 mcg total) by mouth daily before breakfast. 30 tablet 6  . Prenatal Vit-Fe Fumarate-FA (PRENATAL VITAMIN) 27-0.8 MG TABS Take 1 tablet by mouth daily. (Patient not taking: Reported on 06/14/2019) 30 tablet 11   No current facility-administered medications on file prior to visit.    Allergies  Allergen Reactions  . Pollen Extract     Social History   Socioeconomic History  . Marital status: Married    Spouse name: Automotive engineer  . Number of children: 3  . Years of education: Not on file  . Highest education level: Not on file  Occupational History  . Occupation: unemployed  Tobacco Use  . Smoking status: Never Smoker  . Smokeless tobacco: Never Used  Substance and Sexual Activity  . Alcohol use: No  . Drug use: No  . Sexual activity: Yes    Birth control/protection: None  Other Topics Concern  . Not on file  Social History Narrative   ** Merged History Encounter **       Social Determinants of Health   Financial Resource Strain: Low Risk   . Difficulty of  Paying Living Expenses: Not hard at all  Food Insecurity: No Food Insecurity  . Worried About Programme researcher, broadcasting/film/video in the Last Year: Never true  . Ran Out of Food in the Last Year: Never true  Transportation Needs: No Transportation Needs  . Lack of Transportation (Medical): No  . Lack of Transportation (Non-Medical): No  Physical Activity: Insufficiently Active  . Days of Exercise per Week: 3 days  . Minutes of Exercise per Session: 30 min    Stress: No Stress Concern Present  . Feeling of Stress : Not at all  Social Connections: Not Isolated  . Frequency of Communication with Friends and Family: More than three times a week  . Frequency of Social Gatherings with Friends and Family: Three times a week  . Attends Religious Services: More than 4 times per year  . Active Member of Clubs or Organizations: Not on file  . Attends Banker Meetings: 1 to 4 times per year  . Marital Status: Married  Catering manager Violence:   . Fear of Current or Ex-Partner:   . Emotionally Abused:   Marland Kitchen Physically Abused:   . Sexually Abused:     Family History  Problem Relation Age of Onset  . Hypertension Mother     Past Surgical History:  Procedure Laterality Date  . CESAREAN SECTION    . CESAREAN SECTION      ROS: Review of Systems Negative except as stated above  PHYSICAL EXAM: Vitals with BMI 09/05/2019 09/05/2019 06/29/2019  Height - - -  Weight - 150 lbs 13 oz 146 lbs 13 oz  BMI - - 29.63  Systolic 152 159 681  Diastolic 96 100 90  Pulse - 99 77  SpO2- 99%, room air  Temperature- 98.33F, oral  Physical Exam General appearance - alert, well appearing, and in no distress and oriented to person, place, and time Mental status - alert, oriented to person, place, and time, normal mood, behavior, speech, dress, motor activity, and thought processes Eyes - pupils equal and reactive, extraocular eye movements intact Ears - bilateral TM's and external ear canals normal Nose - normal and patent, no erythema, discharge or polyps and normal nontender sinuses Mouth - mucous membranes moist, pharynx normal without lesions Neck - supple, no significant adenopathy Lymphatics - no palpable lymphadenopathy, no hepatosplenomegaly Chest - clear to auscultation, no wheezes, rales or rhonchi, symmetric air entry, no tachypnea, retractions or cyanosis Heart - normal rate, regular rhythm, normal S1, S2, no murmurs, rubs, clicks or  gallops  ASSESSMENT AND PLAN: 1. Acute ear pain, bilateral: -Will try course of Amoxicillin-Clavulanate for bilateral ear pain. -Referral to ENT for frequent bilateral ear pain and hearing loss which comes and goes. - Ambulatory referral to ENT - amoxicillin-clavulanate (AUGMENTIN) 875-125 MG tablet; Take 1 tablet by mouth 2 (two) times daily.  Dispense: 10 tablet; Refill: 0  2. Essential hypertension: -Blood pressure elevated during today's visit. Patient reports took blood pressure this morning and it was 128/89. Reports she has not taken blood pressure medication today and that she is nervous during this visit. Patient otherwise asymptomatic. -Patient should continue prescribed blood pressure medications and follow-up with primary physician as scheduled.  3. Language barrier: -[employer] participated during this visit. Interpreter Name: Nickolas Madrid, ID#: [id number].  Patient was given the opportunity to ask questions.  Patient verbalized understanding of the plan and was able to repeat key elements of the plan. Patient was given clear instructions to go to Emergency Department or  return to medical center if symptoms don't improve, worsen, or new problems develop.The patient verbalized understanding.   Rema Fendt, NP

## 2019-09-08 ENCOUNTER — Ambulatory Visit: Payer: Self-pay

## 2019-10-06 MED FILL — LEVOTHYROXINE SODIUM 50 MCG: 50 | 30 days supply | Qty: 30 | Fill #2

## 2019-11-14 MED FILL — LABETALOL HCL 100 MG TABLET: 100 | 30 days supply | Qty: 60 | Fill #1

## 2019-11-14 MED FILL — LEVOTHYROXINE SODIUM 50 MCG: 50 | 30 days supply | Qty: 30 | Fill #3

## 2020-01-04 MED FILL — LEVOTHYROXINE SODIUM 50 MCG: 50 | 30 days supply | Qty: 30 | Fill #4

## 2020-01-16 ENCOUNTER — Ambulatory Visit: Payer: Self-pay

## 2020-02-14 ENCOUNTER — Other Ambulatory Visit: Payer: Self-pay | Admitting: Family Medicine

## 2020-02-14 DIAGNOSIS — E038 Other specified hypothyroidism: Secondary | ICD-10-CM

## 2020-02-14 MED ORDER — LEVOTHYROXINE SODIUM 50 MCG PO TABS: 50.0000 ug | ORAL_TABLET | Freq: Every day | ORAL | 0 refills | Status: DC

## 2020-02-14 MED FILL — LEVOTHYROXINE SODIUM 50 MCG: 50 | 30 days supply | Qty: 30 | Fill #0

## 2020-02-14 NOTE — Telephone Encounter (Signed)
Requested Prescriptions  Pending Prescriptions Disp Refills  . levothyroxine (SYNTHROID) 50 MCG tablet 30 tablet 0    Sig: Take 1 tablet (50 mcg total) by mouth daily before breakfast.     Endocrinology:  Hypothyroid Agents Failed - 02/14/2020  4:03 PM      Failed - TSH needs to be rechecked within 3 months after an abnormal result. Refill until TSH is due.      Passed - TSH in normal range and within 360 days    TSH  Date Value Ref Range Status  06/14/2019 2.160 0.450 - 4.500 uIU/mL Final         Passed - Valid encounter within last 12 months    Recent Outpatient Visits          5 months ago Acute ear pain, bilateral   [facility] [place], [place] J, NP   7 months ago Acute otitis externa of both ears, unspecified type   [place] [facility] [place], [place], NP   8 months ago Other specified hypothyroidism   [facility] Hoy Register, MD   1 year ago Hypothyroidism affecting pregnancy in second trimester   [facility] Hoy Register, MD   1 year ago Other specified hypothyroidism   [facility] Hoy Register, MD      Future Appointments            In 2 weeks Hoy Register, MD [facility]            Courtesy refill given; #30 at this time. Is overdue for 6 mo. f/u.   Has f/u appt. 03/04/20.

## 2020-02-14 NOTE — Telephone Encounter (Signed)
Medication Refill - Medication: levothyroxine (SYNTHROID) 50 MCG tablet    Has the patient contacted their pharmacy? Yes.   (Agent: If no, request that the patient contact the pharmacy for the refill.) (Agent: If yes, when and what did the pharmacy advise?)  Preferred Pharmacy (with phone number or street name):  [facility] - [place], [place] - [place] [address]  [address] [place] [address]  Phone: [phone] Fax: [phone]     Agent: Please be advised that RX refills may take up to 3 business days. We ask that you follow-up with your pharmacy.

## 2020-02-15 ENCOUNTER — Telehealth: Payer: Self-pay

## 2020-02-15 NOTE — Telephone Encounter (Signed)
Copied from CRM [id number]. Topic: Appointment Scheduling - Scheduling Inquiry for Clinic >> Feb 14, 2020  3:53 PM Randol Kern wrote: Reason for CRM: Pt called and would like to renew her orange card, please advise   Best contact: [phone]

## 2020-02-16 ENCOUNTER — Telehealth: Payer: Self-pay | Admitting: Family Medicine

## 2020-02-16 NOTE — Telephone Encounter (Signed)
I return Pt call, Pt already has a financial appt 02/23/20

## 2020-02-23 ENCOUNTER — Ambulatory Visit: Payer: Self-pay | Attending: Family Medicine

## 2020-02-23 ENCOUNTER — Other Ambulatory Visit: Payer: Self-pay

## 2020-02-23 MED FILL — LEVOTHYROXINE SODIUM 50 MCG: 50 | 30 days supply | Qty: 30 | Fill #0

## 2020-03-03 ENCOUNTER — Telehealth: Payer: Self-pay

## 2020-03-03 NOTE — Telephone Encounter (Signed)
Copied from CRM [id number]. Topic: General - Other >> Feb 29, 2020  2:07 PM Tamela Oddi wrote: Reason for CRM: Patient would like a call from [facility] or his assistant regarding renewal of her orange card. Please call to discuss at [phone]

## 2020-03-04 ENCOUNTER — Ambulatory Visit: Payer: Self-pay | Admitting: Family Medicine

## 2020-03-04 ENCOUNTER — Telehealth: Payer: Self-pay | Admitting: Family Medicine

## 2020-03-04 NOTE — Telephone Encounter (Signed)
I return Pt call, no VM unable to LVM since is not set up

## 2020-03-11 MED FILL — LABETALOL HCL 100 MG TABLET: 100 | 30 days supply | Qty: 60 | Fill #2

## 2020-03-29 ENCOUNTER — Other Ambulatory Visit: Payer: Self-pay | Admitting: Family Medicine

## 2020-03-29 DIAGNOSIS — E038 Other specified hypothyroidism: Secondary | ICD-10-CM

## 2020-03-29 MED ORDER — LEVOTHYROXINE SODIUM 50 MCG PO TABS: 50.0000 ug | ORAL_TABLET | Freq: Every day | ORAL | 0 refills | Status: DC

## 2020-03-29 MED FILL — LEVOTHYROXINE SODIUM 50 MCG: 50 | 30 days supply | Qty: 30 | Fill #0

## 2020-03-29 NOTE — Telephone Encounter (Signed)
Medication:  levothyroxine (SYNTHROID) 50 MCG tablet [[id number]]   Has the patient contacted their pharmacy? No  (Agent: If no, request that the patient contact the pharmacy for the refill.)   Preferred Pharmacy (with phone number or street name):  [facility] - [place], [place] - [place] [address]  [address] [place] [address]  Phone: [phone] Fax: [phone]    Agent: Please be advised that RX refills may take up to 3 business days. We ask that you follow-up with your pharmacy.

## 2020-04-17 ENCOUNTER — Other Ambulatory Visit: Payer: Self-pay

## 2020-04-17 ENCOUNTER — Ambulatory Visit: Payer: Self-pay | Attending: Family Medicine | Admitting: Family Medicine

## 2020-04-17 ENCOUNTER — Other Ambulatory Visit: Payer: Self-pay | Admitting: Family Medicine

## 2020-04-17 DIAGNOSIS — E038 Other specified hypothyroidism: Secondary | ICD-10-CM

## 2020-04-17 DIAGNOSIS — I10 Essential (primary) hypertension: Secondary | ICD-10-CM

## 2020-04-17 MED ORDER — LABETALOL HCL 100 MG PO TABS: 100.0000 mg | ORAL_TABLET | Freq: Two times a day (BID) | ORAL | 6 refills | Status: DC

## 2020-04-17 MED FILL — LABETALOL HCL 100 MG TABS: 100 | 30 days supply | Qty: 60 | Fill #0

## 2020-04-17 NOTE — Progress Notes (Signed)
Virtual Visit via Telephone Note  I connected with Sharon Brennan, on 04/17/2020 at 10:05 AM by telephone due to the COVID-19 pandemic and verified that I am speaking with the correct person using two identifiers.   Consent: I discussed the limitations, risks, security and privacy concerns of performing an evaluation and management service by telephone and the availability of in person appointments. I also discussed with the patient that there may be a patient responsible charge related to this service. The patient expressed understanding and agreed to proceed.   Location of Patient: [place] of Provider: Home   Persons participating in Telemedicine visit: Burgundy Matuszak Farrington-CMA Dr. Alvis Lemmings Interpreter - Ignacia Bayley DV#[id number]     History of Present Illness: Sharon Brennan is a 45 year old female with a history of hypertension, hypothyroidism who presents today for a follow-up visit BP at home was controlled at 126/82 today. She has no dizziness and tolerates her antihypertensive okay. She has no additional concerns today. Doing well on her thyroid medication.  Past Medical History:  Diagnosis Date  . Gestational diabetes   . Hypertension   . Hypothyroidism    Allergies  Allergen Reactions  . Pollen Extract     Current Outpatient Medications on File Prior to Visit  Medication Sig Dispense Refill  . Acetaminophen 325 MG CAPS Take 1 tablet by mouth as needed.     . labetalol (NORMODYNE) 100 MG tablet Take 1 tablet (100 mg total) by mouth 2 (two) times daily. 60 tablet 6  . levothyroxine (SYNTHROID) 50 MCG tablet Take 1 tablet (50 mcg total) by mouth daily before breakfast. 30 tablet 0  . amoxicillin-clavulanate (AUGMENTIN) 875-125 MG tablet Take 1 tablet by mouth 2 (two) times daily. (Patient not taking: Reported on 04/17/2020) 10 tablet 0  . ibuprofen (ADVIL) 600 MG tablet Take 1 tablet (600 mg total) by mouth every 6 (six) hours as needed. (Patient  not taking: No sig reported) 90 tablet 0  . Prenatal Vit-Fe Fumarate-FA (PRENATAL VITAMIN) 27-0.8 MG TABS Take 1 tablet by mouth daily. (Patient not taking: No sig reported) 30 tablet 11   No current facility-administered medications on file prior to visit.    Observations/Objective: Awake, alert, oriented x3 Not in acute distress  Assessment and Plan: 1. Other specified hypothyroidism Will send off thyroid labs and adjust regimen accor - TSH - T4, free - Basic Metabolic Panel  2. Essential hypertension Controlled Counseled on blood pressure goal of less than 130/80, low-sodium, DASH diet, medication compliance, 150 minutes of moderate intensity exercise per week. Discussed medication compliance, adverse effects. - labetalol (NORMODYNE) 100 MG tablet; Take 1 tablet (100 mg total) by mouth 2 (two) times daily.  Dispense: 60 tablet; Refill: 6   Follow Up Instructions: 6 month   I discussed the assessment and treatment plan with the patient. The patient was provided an opportunity to ask questions and all were answered. The patient agreed with the plan and demonstrated an understanding of the instructions.   The patient was advised to call back or seek an in-person evaluation if the symptoms worsen or if the condition fails to improve as anticipated.     I provided 12 minutes total of non-face-to-face time during this encounter including median intraservice time, reviewing previous notes, investigations, ordering medications, medical decision making, coordinating care and patient verbalized understanding at the end of the visit.     Hoy Register, MD, FAAFP. [facility] [place], [place] [phone]   04/17/2020, 10:05  AM

## 2020-04-18 ENCOUNTER — Other Ambulatory Visit: Payer: Self-pay | Admitting: Family Medicine

## 2020-04-18 DIAGNOSIS — E038 Other specified hypothyroidism: Secondary | ICD-10-CM

## 2020-04-18 LAB — BASIC METABOLIC PANEL
BUN/Creatinine Ratio: 28 — ABNORMAL HIGH (ref 9–23)
BUN: 16 mg/dL (ref 6–24)
CO2: 20 mmol/L (ref 20–29)
Calcium: 9.2 mg/dL (ref 8.7–10.2)
Chloride: 105 mmol/L (ref 96–106)
Creatinine, Ser: 0.57 mg/dL (ref 0.57–1.00)
GFR calc Af Amer: 130 mL/min/{1.73_m2} (ref >59)
GFR calc non Af Amer: 113 mL/min/{1.73_m2} (ref >59)
Glucose: 111 mg/dL — ABNORMAL HIGH (ref 65–99)
Potassium: 4.4 mmol/L (ref 3.5–5.2)
Sodium: 141 mmol/L (ref 134–144)

## 2020-04-18 LAB — T4, FREE: Free T4: 1.4 ng/dL (ref 0.82–1.77)

## 2020-04-18 LAB — TSH: TSH: 4.18 u[IU]/mL (ref 0.450–4.500)

## 2020-04-18 MED ORDER — LEVOTHYROXINE SODIUM 50 MCG PO TABS: 50.0000 ug | ORAL_TABLET | Freq: Every day | ORAL | 6 refills | Status: DC

## 2020-05-10 MED FILL — LEVOTHYROXINE SODIUM 50 MCG: 50 | 30 days supply | Qty: 30 | Fill #0

## 2020-05-10 MED FILL — LABETALOL HCL 100 MG TABS: 100 | 30 days supply | Qty: 60 | Fill #0

## 2020-07-03 MED FILL — LEVOTHYROXINE SODIUM 50 MCG: 50 | 30 days supply | Qty: 30 | Fill #1

## 2020-07-30 ENCOUNTER — Other Ambulatory Visit: Payer: Self-pay

## 2020-07-30 MED FILL — Labetalol HCl Tab 100 MG: ORAL | 30 days supply | Qty: 60 | Fill #0 | Status: AC

## 2020-08-01 ENCOUNTER — Other Ambulatory Visit: Payer: Self-pay

## 2020-08-22 ENCOUNTER — Other Ambulatory Visit: Payer: Self-pay

## 2020-08-22 MED FILL — Levothyroxine Sodium Tab 50 MCG: ORAL | 30 days supply | Qty: 30 | Fill #0 | Status: AC

## 2020-08-26 ENCOUNTER — Other Ambulatory Visit: Payer: Self-pay

## 2020-09-18 ENCOUNTER — Other Ambulatory Visit: Payer: Self-pay

## 2020-09-18 ENCOUNTER — Ambulatory Visit: Payer: Self-pay | Attending: Family Medicine

## 2020-10-08 ENCOUNTER — Other Ambulatory Visit: Payer: Self-pay

## 2020-10-08 MED FILL — Labetalol HCl Tab 100 MG: ORAL | 30 days supply | Qty: 60 | Fill #1 | Status: AC

## 2020-10-08 MED FILL — Levothyroxine Sodium Tab 50 MCG: ORAL | 30 days supply | Qty: 30 | Fill #1 | Status: AC

## 2020-10-11 ENCOUNTER — Other Ambulatory Visit: Payer: Self-pay

## 2020-10-15 ENCOUNTER — Ambulatory Visit: Payer: Self-pay | Attending: Family Medicine | Admitting: Family Medicine

## 2020-10-15 ENCOUNTER — Other Ambulatory Visit: Payer: Self-pay

## 2020-10-17 ENCOUNTER — Telehealth: Payer: Self-pay | Admitting: Family Medicine

## 2020-10-17 NOTE — Telephone Encounter (Signed)
Attempted to reschedule appt from 10/15/20 with PCP. Provider called twice and pt did not answer and couldn't leave a voice message since the Voicemail is not set up. I called the pt's number twice and had the same result. Spoke w/ Pt's mother as contact and left msg to have pt call back to resched this missed appt. Dr. Alvis Lemmings is available in Sept so if pt calls back , we could schedule w/ [facility] for her follow up.

## 2020-12-05 ENCOUNTER — Other Ambulatory Visit: Payer: Self-pay

## 2020-12-05 MED FILL — Levothyroxine Sodium Tab 50 MCG: ORAL | 30 days supply | Qty: 30 | Fill #2 | Status: AC

## 2020-12-10 ENCOUNTER — Other Ambulatory Visit: Payer: Self-pay

## 2020-12-10 MED FILL — Labetalol HCl Tab 100 MG: ORAL | 30 days supply | Qty: 60 | Fill #2 | Status: AC

## 2020-12-11 ENCOUNTER — Other Ambulatory Visit: Payer: Self-pay

## 2021-01-27 ENCOUNTER — Other Ambulatory Visit: Payer: Self-pay

## 2021-01-27 MED FILL — Levothyroxine Sodium Tab 50 MCG: ORAL | 30 days supply | Qty: 30 | Fill #3 | Status: CN

## 2021-01-27 MED FILL — Levothyroxine Sodium Tab 50 MCG: ORAL | 30 days supply | Qty: 30 | Fill #3 | Status: AC

## 2021-02-07 ENCOUNTER — Other Ambulatory Visit: Payer: Self-pay

## 2021-02-07 MED FILL — Labetalol HCl Tab 100 MG: ORAL | 30 days supply | Qty: 60 | Fill #3 | Status: AC

## 2021-02-10 ENCOUNTER — Other Ambulatory Visit: Payer: Self-pay

## 2021-03-18 ENCOUNTER — Other Ambulatory Visit: Payer: Self-pay

## 2021-03-18 MED FILL — Levothyroxine Sodium Tab 50 MCG: ORAL | 30 days supply | Qty: 30 | Fill #4 | Status: AC

## 2021-03-19 ENCOUNTER — Other Ambulatory Visit: Payer: Self-pay

## 2021-03-25 ENCOUNTER — Telehealth: Payer: Self-pay | Admitting: Family Medicine

## 2021-03-25 NOTE — Telephone Encounter (Signed)
I return Pt call, schedule a financial appt for 04/03/21

## 2021-03-25 NOTE — Telephone Encounter (Signed)
I return Pt call, unable to LVM is not set up

## 2021-03-25 NOTE — Telephone Encounter (Signed)
Copied from CRM [id number]. Topic: General - Other >> Mar 25, 2021 11:03 AM Marylen Ponto wrote: Reason for CRM: Pt stated she had a missed call from the office. Pt requests call back. Cb# [phone]

## 2021-03-25 NOTE — Telephone Encounter (Signed)
Copied from CRM [id number]. Topic: General - Other >> Mar 24, 2021  2:49 PM Jaquita Rector A wrote: Reason for CRM: Patient need a call back from [facility] need to renew [facility] card have a dental appointment in January. Please call Ph# [phone]

## 2021-04-03 ENCOUNTER — Ambulatory Visit: Payer: Self-pay | Attending: Family Medicine | Admitting: Family Medicine

## 2021-04-03 ENCOUNTER — Encounter: Payer: Self-pay | Admitting: Family Medicine

## 2021-04-03 ENCOUNTER — Other Ambulatory Visit: Payer: Self-pay

## 2021-04-03 ENCOUNTER — Ambulatory Visit: Payer: Self-pay

## 2021-04-03 DIAGNOSIS — I10 Essential (primary) hypertension: Secondary | ICD-10-CM

## 2021-04-03 DIAGNOSIS — Z131 Encounter for screening for diabetes mellitus: Secondary | ICD-10-CM

## 2021-04-03 DIAGNOSIS — E038 Other specified hypothyroidism: Secondary | ICD-10-CM

## 2021-04-03 NOTE — Progress Notes (Signed)
Virtual Visit via Telephone Note  I connected with Sharon Brennan, on 04/03/2021 at 9:04 AM by telephone due to the COVID-19 pandemic and verified that I am speaking with the correct person using two identifiers.   Consent: I discussed the limitations, risks, security and privacy concerns of performing an evaluation and management service by telephone and the availability of in person appointments. I also discussed with the patient that there may be a patient responsible charge related to this service. The patient expressed understanding and agreed to proceed.   Location of Patient: Home  Location of Provider: Clinic   Persons participating in Telemedicine visit: Emmagene Ortner Farrington-CMA Dr. Margarita Rana Interpreter     History of Present Illness: Sharon Brennan is a 45 y.o. year old female with a history of hypertension, hypothyroidism who presents today for a follow-up visit. Last visit was in 04/2020 and she endorses compliance with her medications. Doing well on her antihypertensive and her Levothyroxine for Hypothyroidism. BP at home has been controlled. Today her BP is 127/86.   Past Medical History:  Diagnosis Date   Gestational diabetes    Hypertension    Hypothyroidism    Allergies  Allergen Reactions   Pollen Extract     Current Outpatient Medications on File Prior to Visit  Medication Sig Dispense Refill   Acetaminophen 325 MG CAPS Take 1 tablet by mouth as needed.      ibuprofen (ADVIL) 600 MG tablet Take 1 tablet (600 mg total) by mouth every 6 (six) hours as needed. (Patient not taking: No sig reported) 90 tablet 0   labetalol (NORMODYNE) 100 MG tablet TAKE 1 TABLET (100 MG TOTAL) BY MOUTH 2 (TWO) TIMES DAILY. 60 tablet 6   levothyroxine (SYNTHROID) 50 MCG tablet TAKE 1 TABLET (50 MCG TOTAL) BY MOUTH DAILY BEFORE BREAKFAST. 30 tablet 6   Prenatal Vit-Fe Fumarate-FA (PRENATAL VITAMIN) 27-0.8 MG TABS Take 1 tablet by mouth daily.  (Patient not taking: No sig reported) 30 tablet 11   No current facility-administered medications on file prior to visit.    ROS: See HPI  Observations/Objective: BP 127/86 Awake, alert, oriented x3 Not in acute distress Normal mood   CMP Latest Ref Rng & Units 04/17/2020 06/14/2019 02/08/2019  Glucose 65 - 99 mg/dL 111(H) 112(H) 87  BUN 6 - 24 mg/dL '16 16 16  ' Creatinine 0.57 - 1.00 mg/dL 0.57 0.62 0.39(L)  Sodium 134 - 144 mmol/L 141 140 135  Potassium 3.5 - 5.2 mmol/L 4.4 4.2 4.0  Chloride 96 - 106 mmol/L 105 101 106  CO2 20 - 29 mmol/L 20 20 20(L)  Calcium 8.7 - 10.2 mg/dL 9.2 9.9 9.0  Total Protein 6.5 - 8.1 g/dL - - 6.8  Total Bilirubin 0.3 - 1.2 mg/dL - - 0.1(L)  Alkaline Phos 38 - 126 U/L - - 190(H)  AST 15 - 41 U/L - - 24  ALT 0 - 44 U/L - - 22    Lipid Panel     Component Value Date/Time   CHOL 167 06/14/2019 1126   TRIG 47 06/14/2019 1126   HDL 70 06/14/2019 1126   CHOLHDL 2.4 06/14/2019 1126   CHOLHDL 3.0 03/04/2016 0939   VLDL 19 03/04/2016 0939   LDLCALC 87 06/14/2019 1126   LABVLDL 10 06/14/2019 1126    Lab Results  Component Value Date   HGBA1C 5.5 06/14/2019    Lab Results  Component Value Date   TSH 4.180 04/17/2020    Assessment and Plan: 1. Essential  hypertension Controlled Continue current medications Counseled on blood pressure goal of less than 130/80, low-sodium, DASH diet, medication compliance, 150 minutes of moderate intensity exercise per week. Discussed medication compliance, adverse effects. - LP+Non-HDL Cholesterol; Future - CMP14+EGFR; Future  2. Other specified hypothyroidism Controlled Will send of thyroid labs and adjust regimen accordingly - T4, free; Future - TSH; Future  3. Screening for diabetes mellitus - Hemoglobin A1c; Future   Follow Up Instructions: Fasting labs tomorrow morning, follow-up for chronic disease management in 6 months.   I discussed the assessment and treatment plan with the patient. The  patient was provided an opportunity to ask questions and all were answered. The patient agreed with the plan and demonstrated an understanding of the instructions.   The patient was advised to call back or seek an in-person evaluation if the symptoms worsen or if the condition fails to improve as anticipated.     I provided 12 minutes total of non-face-to-face time during this encounter.   [name], MD, FAAFP. [facility] [place], [place]   04/03/2021, 9:04 AM

## 2021-04-04 ENCOUNTER — Other Ambulatory Visit: Payer: Self-pay

## 2021-04-04 ENCOUNTER — Ambulatory Visit: Payer: Self-pay | Attending: Family Medicine

## 2021-04-04 DIAGNOSIS — E038 Other specified hypothyroidism: Secondary | ICD-10-CM

## 2021-04-04 DIAGNOSIS — Z23 Encounter for immunization: Secondary | ICD-10-CM

## 2021-04-04 DIAGNOSIS — I10 Essential (primary) hypertension: Secondary | ICD-10-CM

## 2021-04-04 DIAGNOSIS — Z131 Encounter for screening for diabetes mellitus: Secondary | ICD-10-CM

## 2021-04-05 ENCOUNTER — Other Ambulatory Visit: Payer: Self-pay | Admitting: Family Medicine

## 2021-04-05 DIAGNOSIS — E038 Other specified hypothyroidism: Secondary | ICD-10-CM

## 2021-04-05 LAB — CMP14+EGFR
ALT: 15 IU/L (ref 0–32)
AST: 17 IU/L (ref 0–40)
Albumin/Globulin Ratio: 1.7 (ref 1.2–2.2)
Albumin: 4.8 g/dL (ref 3.8–4.8)
Alkaline Phosphatase: 130 IU/L — ABNORMAL HIGH (ref 44–121)
BUN/Creatinine Ratio: 29 — ABNORMAL HIGH (ref 9–23)
BUN: 15 mg/dL (ref 6–24)
Bilirubin Total: 0.3 mg/dL (ref 0.0–1.2)
CO2: 24 mmol/L (ref 20–29)
Calcium: 9.2 mg/dL (ref 8.7–10.2)
Chloride: 98 mmol/L (ref 96–106)
Creatinine, Ser: 0.51 mg/dL — ABNORMAL LOW (ref 0.57–1.00)
Globulin, Total: 2.9 g/dL (ref 1.5–4.5)
Glucose: 94 mg/dL (ref 70–99)
Potassium: 4 mmol/L (ref 3.5–5.2)
Sodium: 138 mmol/L (ref 134–144)
Total Protein: 7.7 g/dL (ref 6.0–8.5)
eGFR: 117 mL/min/{1.73_m2} (ref >59)

## 2021-04-05 LAB — LP+NON-HDL CHOLESTEROL
Cholesterol, Total: 187 mg/dL (ref 100–199)
HDL: 82 mg/dL (ref >39)
LDL Chol Calc (NIH): 95 mg/dL (ref 0–99)
Total Non-HDL-Chol (LDL+VLDL): 105 mg/dL (ref 0–129)
Triglycerides: 53 mg/dL (ref 0–149)
VLDL Cholesterol Cal: 10 mg/dL (ref 5–40)

## 2021-04-05 LAB — HEMOGLOBIN A1C
Est. average glucose Bld gHb Est-mCnc: 120 mg/dL
Hgb A1c MFr Bld: 5.8 % — ABNORMAL HIGH (ref 4.8–5.6)

## 2021-04-05 LAB — TSH: TSH: 3.77 u[IU]/mL (ref 0.450–4.500)

## 2021-04-05 LAB — T4, FREE: Free T4: 1.31 ng/dL (ref 0.82–1.77)

## 2021-04-05 MED ORDER — LEVOTHYROXINE SODIUM 50 MCG PO TABS
ORAL_TABLET | Freq: Every day | ORAL | 6 refills | Status: DC
  Filled 2021-04-05: qty 30, fill #0
  Filled 2021-04-28: qty 30, 30d supply, fill #0
  Filled 2021-06-27: qty 30, 30d supply, fill #1
  Filled 2021-08-14: qty 30, 30d supply, fill #2
  Filled 2021-09-18: qty 30, 30d supply, fill #3
  Filled 2021-10-20: qty 30, 30d supply, fill #4
  Filled 2021-11-19: qty 30, 30d supply, fill #5
  Filled 2021-12-25: qty 30, 30d supply, fill #6

## 2021-04-08 ENCOUNTER — Other Ambulatory Visit: Payer: Self-pay

## 2021-04-28 ENCOUNTER — Other Ambulatory Visit: Payer: Self-pay

## 2021-04-28 ENCOUNTER — Other Ambulatory Visit: Payer: Self-pay | Admitting: Family Medicine

## 2021-04-28 DIAGNOSIS — I10 Essential (primary) hypertension: Secondary | ICD-10-CM

## 2021-04-28 MED ORDER — LABETALOL HCL 100 MG PO TABS
ORAL_TABLET | Freq: Two times a day (BID) | ORAL | 1 refills | Status: DC
  Filled 2021-04-28: qty 60, 30d supply, fill #0
  Filled 2021-07-24: qty 60, 30d supply, fill #1
  Filled 2021-09-18 (×2): qty 60, 30d supply, fill #2
  Filled 2021-10-20 – 2021-11-19 (×2): qty 60, 30d supply, fill #3
  Filled 2022-01-19: qty 60, 30d supply, fill #4
  Filled 2022-03-04 – 2022-03-23 (×2): qty 60, 30d supply, fill #5

## 2021-04-28 NOTE — Telephone Encounter (Signed)
Requested Prescriptions  Pending Prescriptions Disp Refills   labetalol (NORMODYNE) 100 MG tablet 60 tablet 6    Sig: TAKE 1 TABLET (100 MG TOTAL) BY MOUTH 2 (TWO) TIMES DAILY.     Cardiovascular:  Beta Blockers Failed - 04/28/2021 12:49 PM      Failed - Last BP in normal range    BP Readings from Last 1 Encounters:  09/05/19 (!) 152/96         Failed - Valid encounter within last 6 months    Recent Outpatient Visits          3 weeks ago Essential hypertension   [facility] [place], Odette Horns, MD   1 year ago Other specified hypothyroidism   [facility] Hoy Register, MD   1 year ago Acute ear pain, bilateral   [facility] [place], [place], NP   1 year ago Acute otitis externa of both ears, unspecified type   [facility] [place], [place], NP   1 year ago Other specified hypothyroidism    [facility] Hoy Register, MD      Future Appointments            In 5 months Hoy Register, MD [facility]           Passed - Last Heart Rate in normal range    Pulse Readings from Last 1 Encounters:  09/05/19 99

## 2021-04-30 ENCOUNTER — Other Ambulatory Visit: Payer: Self-pay

## 2021-05-26 IMAGING — US US MFM OB FOLLOW UP
1 series · 14 of 28 positions shown · non-contrast
Comparison: none

[Series 1: us mfm ob follow up · 51 acquisitions, 14 frames shown]
[im 2/51]
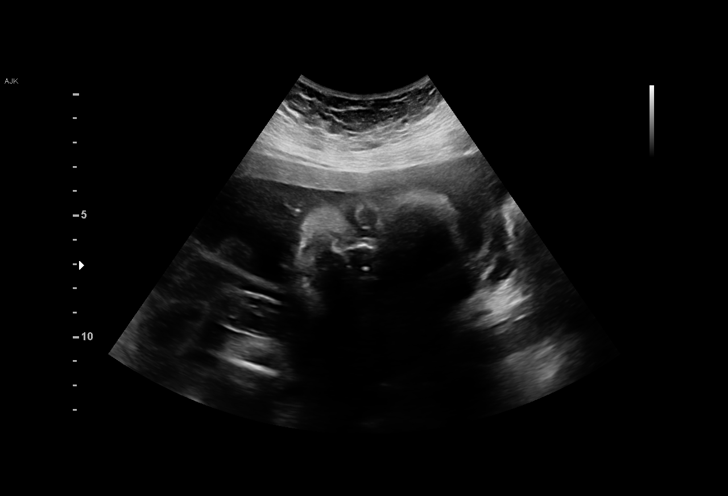
[im 6/51]
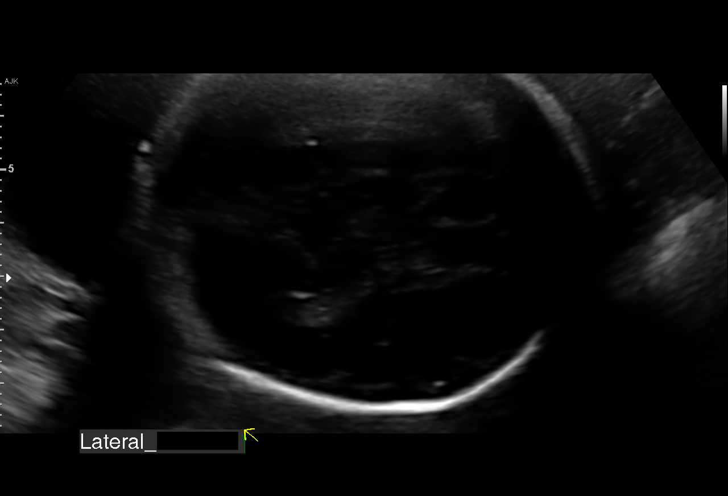
[im 10/51]
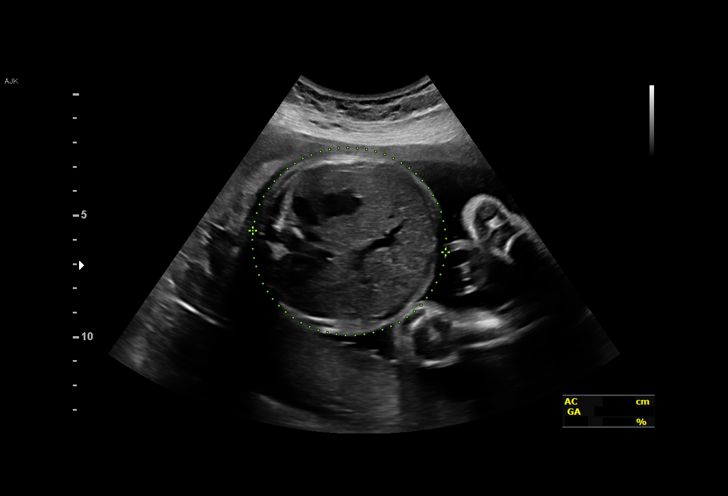
[im 13/51]
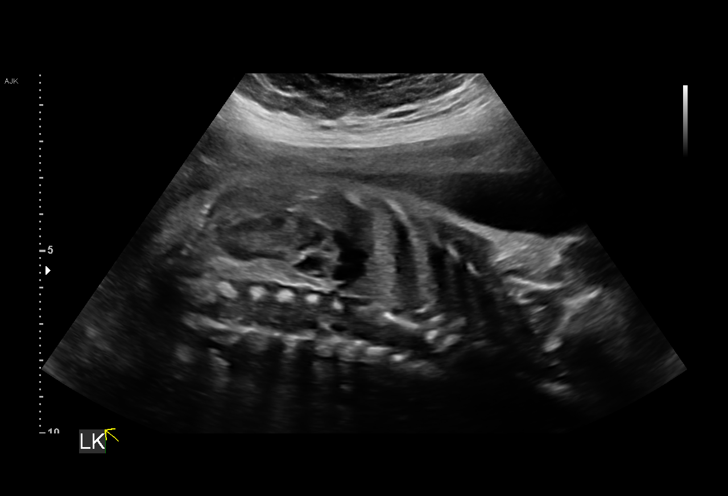
[im 17/51]
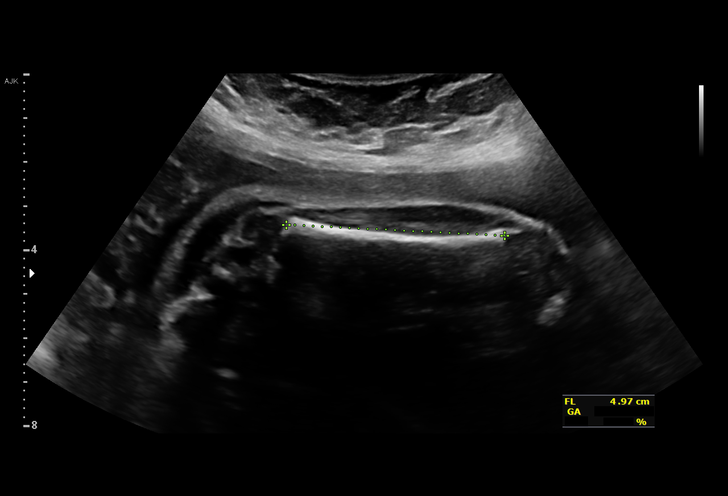
[im 21/51]
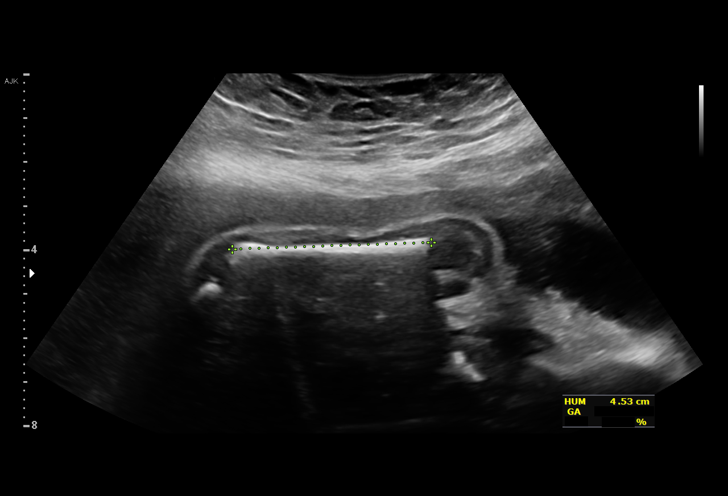
[im 25/51]
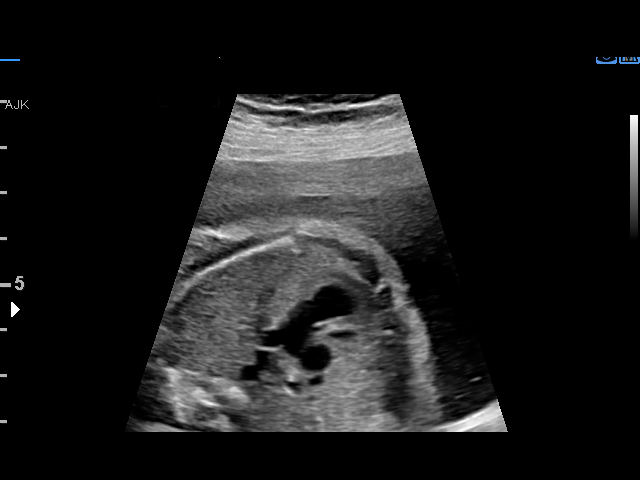
[im 28/51]
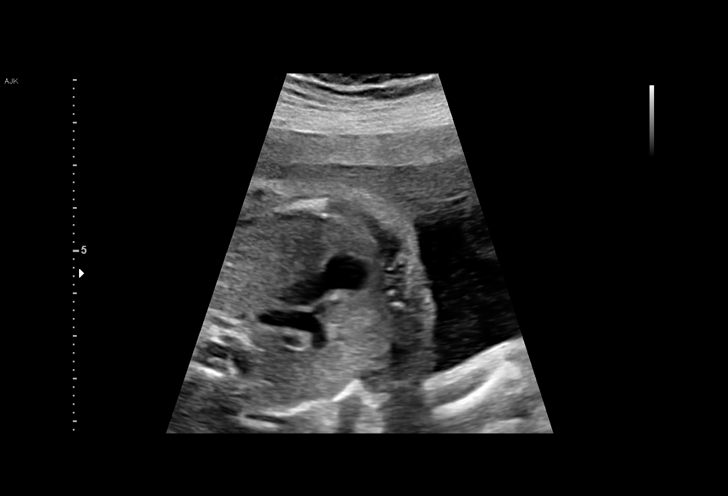
[im 32/51]
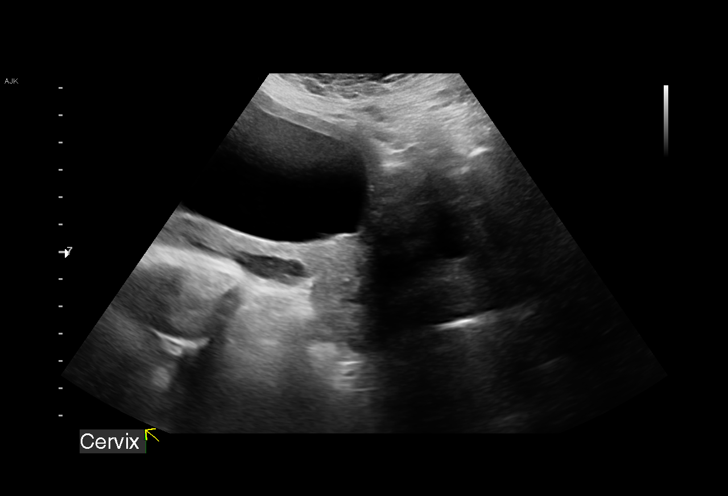
[im 36/51]
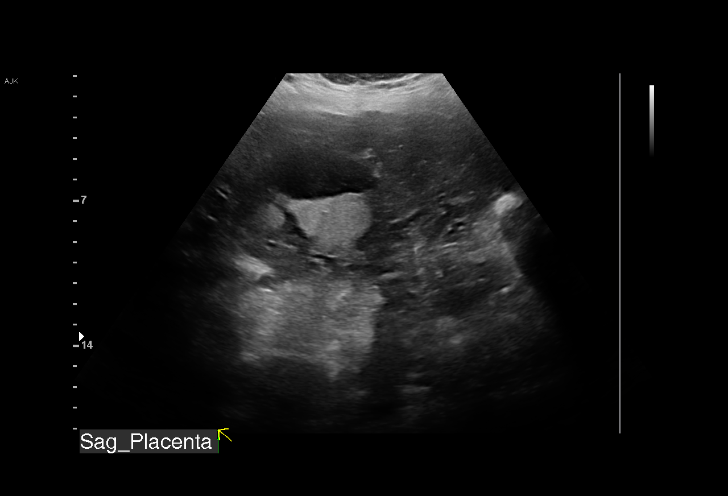
[im 39/51]
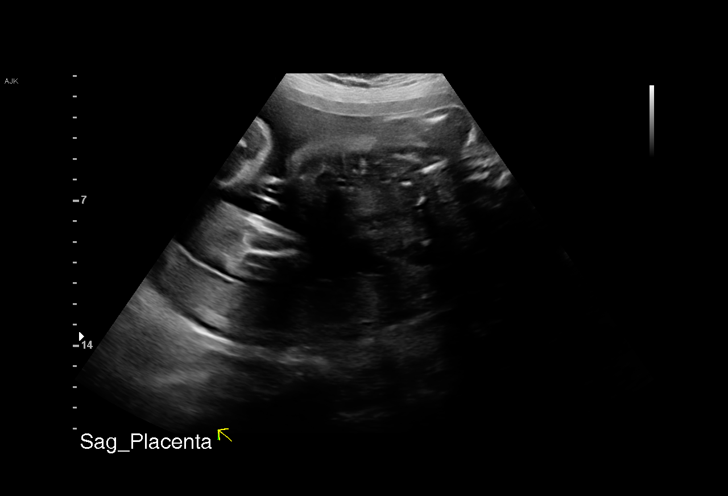
[im 43/51]
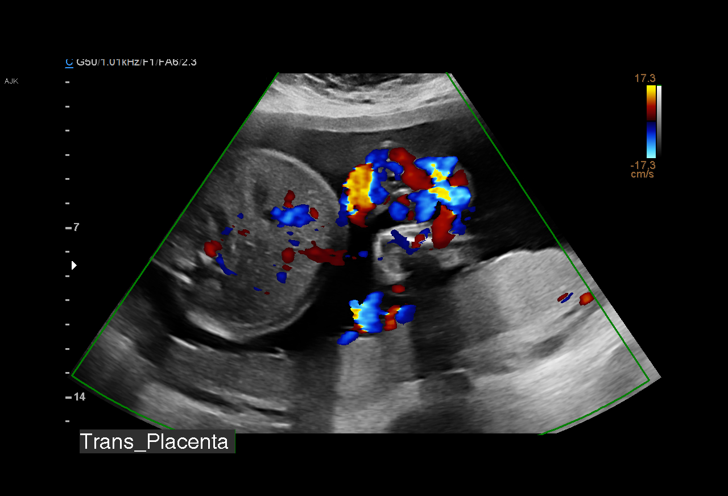
[im 47/51]
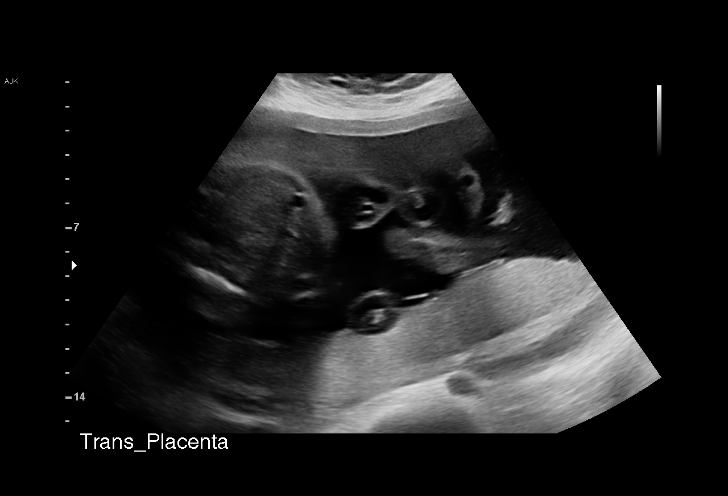
[im 51/51]
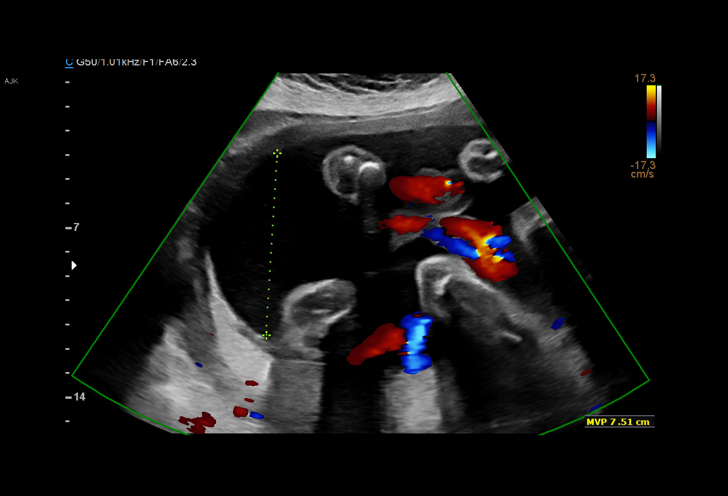

[14 of 28 positions shown; findings below may reference images not displayed]

Health
                   NARINE NP

 ----------------------------------------------------------------------

 ----------------------------------------------------------------------
Indications

  Advanced maternal age multigravida (42),
  second trimester (neg mat14)
  Hypothyroid (synthyroid)
  Previous cesarean delivery, antepartum
  Hypertension - Chronic/Pre-existing ([place])
  27 weeks gestation of pregnancy
 ----------------------------------------------------------------------
Vital Signs

                                                Height:        5'0"
Fetal Evaluation

 Num Of Fetuses:         1
 Cardiac Activity:       Observed
 Presentation:           Cephalic
 Placenta:               Posterior

 Amniotic Fluid
 AFI FV:      Within normal limits

                             Largest Pocket(cm)

Biometry

 BPD:      70.2  mm     G. Age:  28w 1d         78  %    CI:        75.24   %    70 - 86
                                                         FL/HC:      19.1   %    18.6 -
 HC:      256.7  mm     G. Age:  27w 6d         52  %    HC/AC:      1.03        1.05 -
 AC:      248.4  mm     G. Age:  29w 0d         93  %    FL/BPD:     69.8   %    71 - 87
 FL:         49  mm     G. Age:  26w 3d         21  %    FL/AC:      19.7   %    20 - 24
 HUM:      44.9  mm     G. Age:  26w 4d         38  %
 LV:        4.5  mm
 Est. FW:    4471  gm      2 lb 9 oz     79  %
OB History

 Gravidity:    5         Term:   3         SAB:   1
 Living:       3
Gestational Age

 LMP:           27w 0d        Date:  05/24/18                 EDD:   02/28/19
 U/S Today:     27w 6d                                        EDD:   02/22/19
 Best:          27w 0d     Det. By:  LMP  (05/24/18)          EDD:   02/28/19
Anatomy

 Cranium:               Appears normal         Aortic Arch:            Previously seen
 Cavum:                 Appears normal         Ductal Arch:            Previously seen
 Ventricles:            Appears normal         Diaphragm:              Appears normal
 Choroid Plexus:        Previously seen        Stomach:                Appears normal, left
                                                                       sided
 Cerebellum:            Previously seen        Abdomen:                Appears normal
 Posterior Fossa:       Previously seen        Abdominal Wall:         Previously seen
 Nuchal Fold:           Previously seen        Cord Vessels:           Previously seen
 Face:                  Appears normal         Kidneys:                Appear normal
                        (orbits and profile)
 Lips:                  Appears normal         Bladder:                Appears normal
 Thoracic:              Appears normal         Spine:                  Previously seen
 Heart:                 Appears normal         Upper Extremities:      Previously seen
                        (4CH, axis, and
                        situs)
 RVOT:                  Appears normal         Lower Extremities:      Previously seen
 LVOT:                  Appears normal
Cervix Uterus Adnexa

 Cervix
 Length:           3.57  cm.
 Normal appearance by transabdominal scan.

 Uterus
 No abnormality visualized.

 Left Ovary
 Not visualized.

 Right Ovary
 Not visualized.

 Adnexa
 No abnormality visualized.
Impression

 Normal interval growth.
 CHTN- no meds
 AMA (42 yo)
Recommendations

 Repeat growth in 5 weeks.
 Initiate weekly BPP at 36 weeks.

## 2021-06-27 ENCOUNTER — Other Ambulatory Visit: Payer: Self-pay

## 2021-07-24 ENCOUNTER — Other Ambulatory Visit: Payer: Self-pay

## 2021-08-05 IMAGING — US US FETAL BPP W/ NON-STRESS
1 series · 12 of 12 positions shown · non-contrast
Comparison: none

[Series 1: us fetal bpp w/nonstress · 12 acquisitions, 12 frames shown]
[im 1/12]
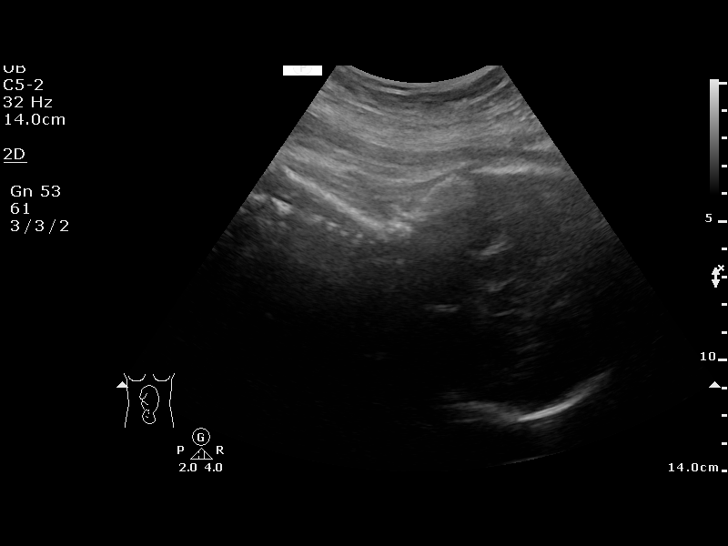
[im 2/12]
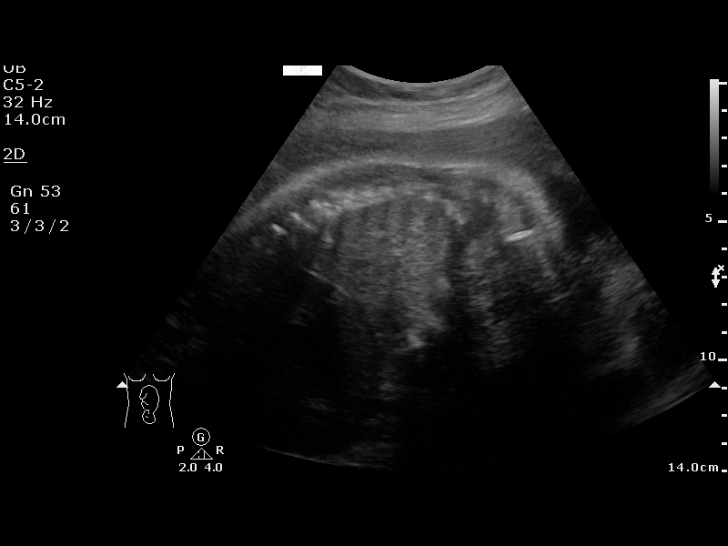
[im 3/12]
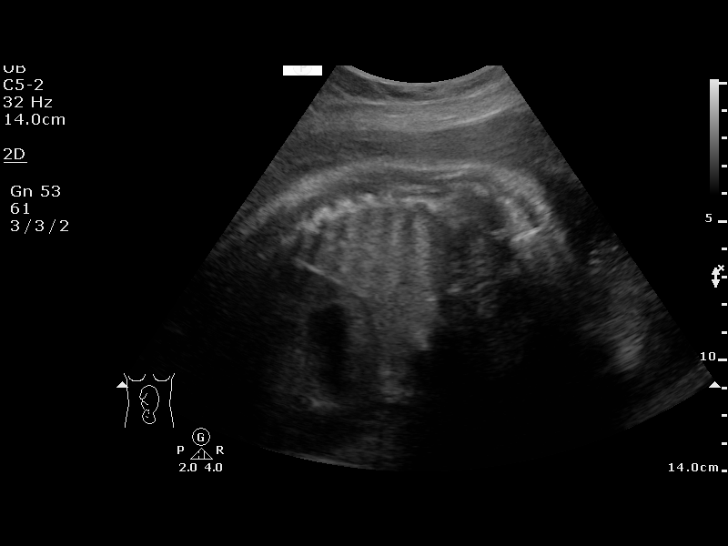
[im 4/12]
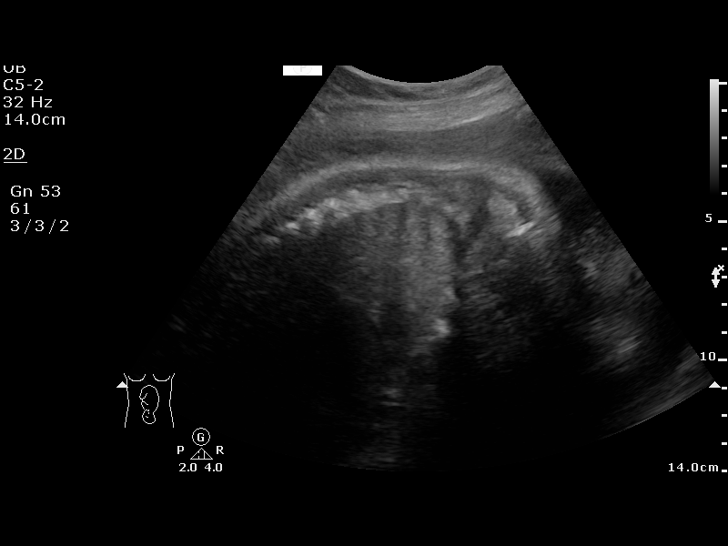
[im 5/12]
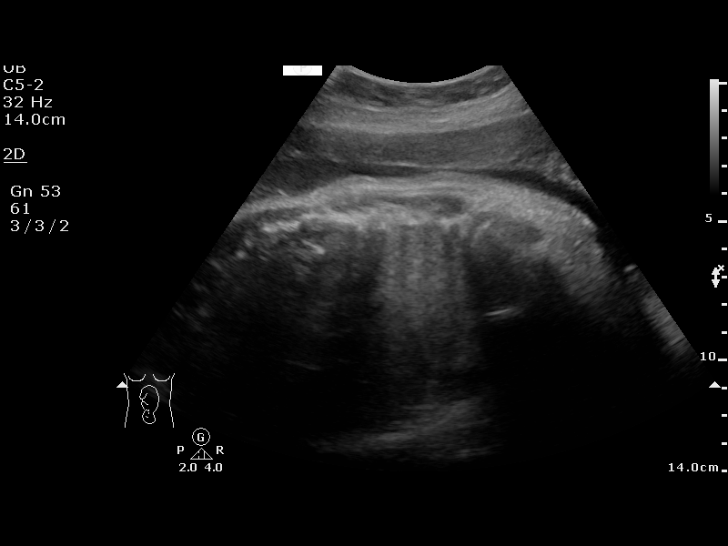
[im 6/12]
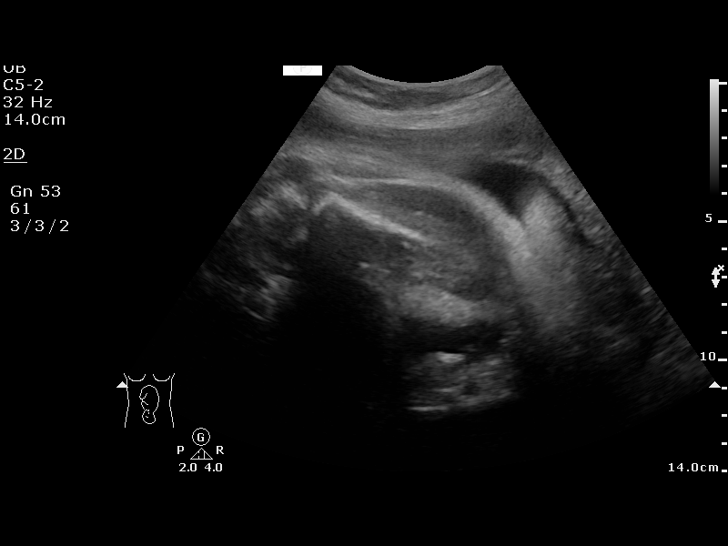
[im 7/12]
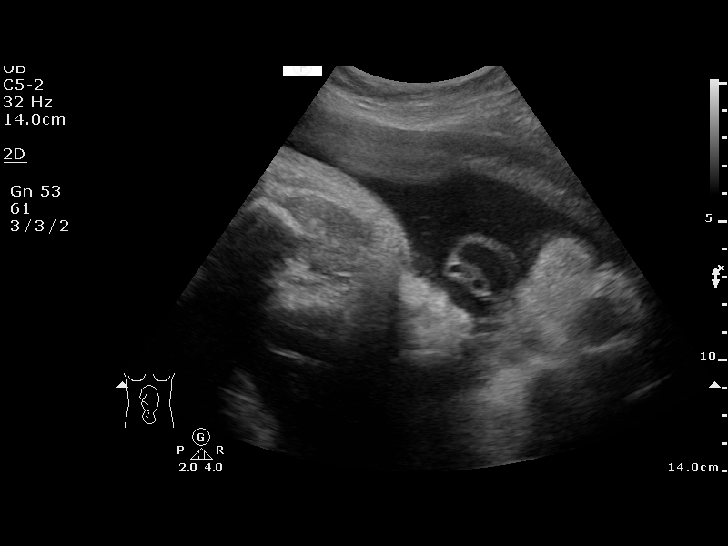
[im 8/12]
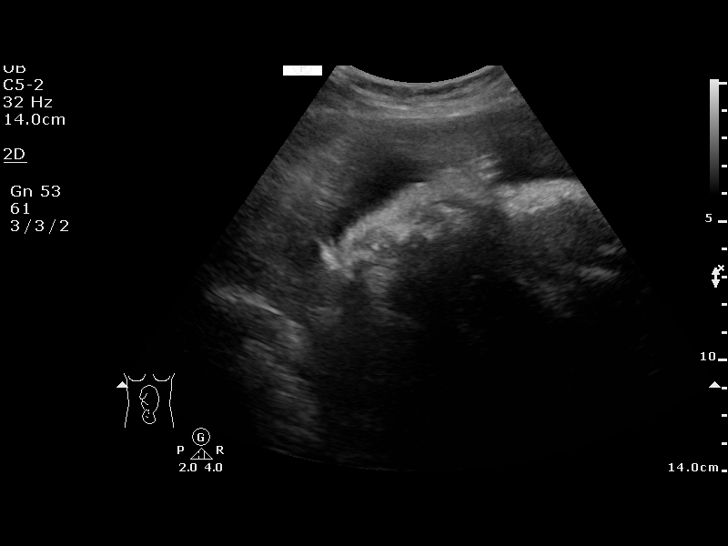
[im 9/12]
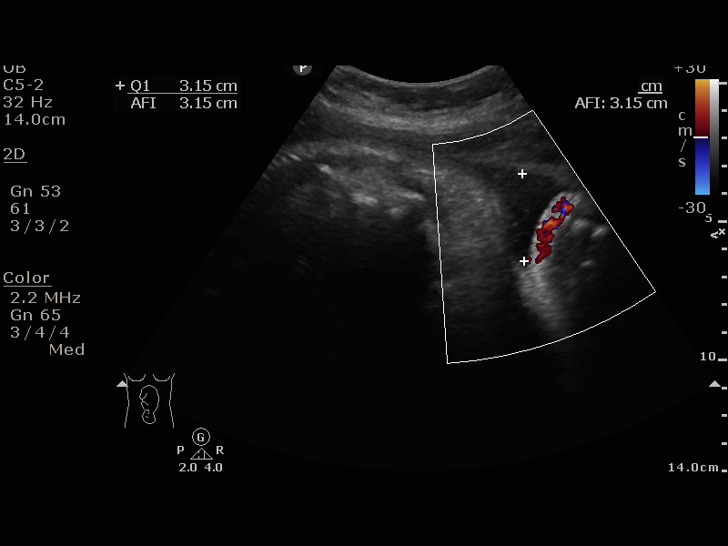
[im 10/12]
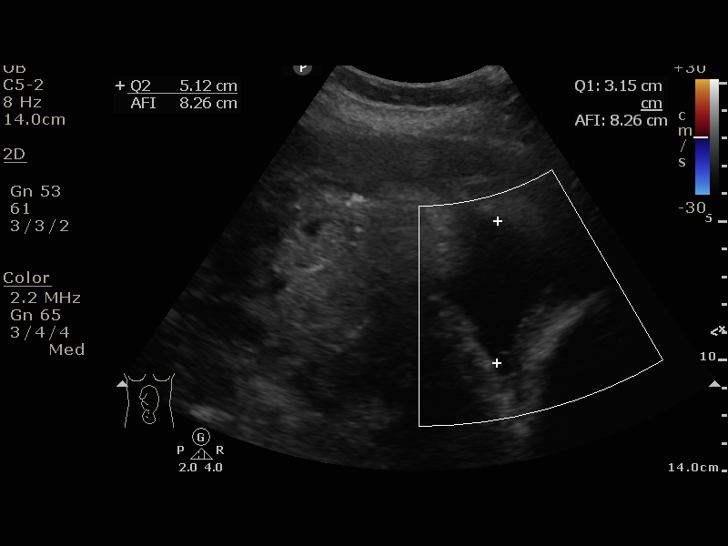
[im 11/12]
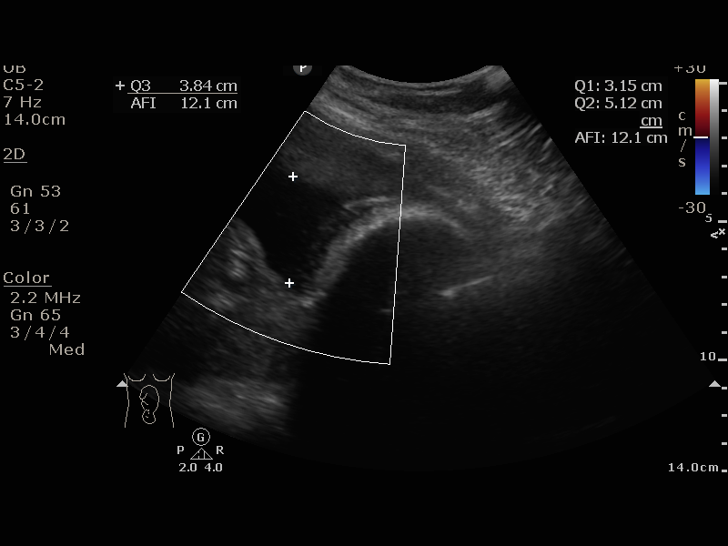
[im 12/12]
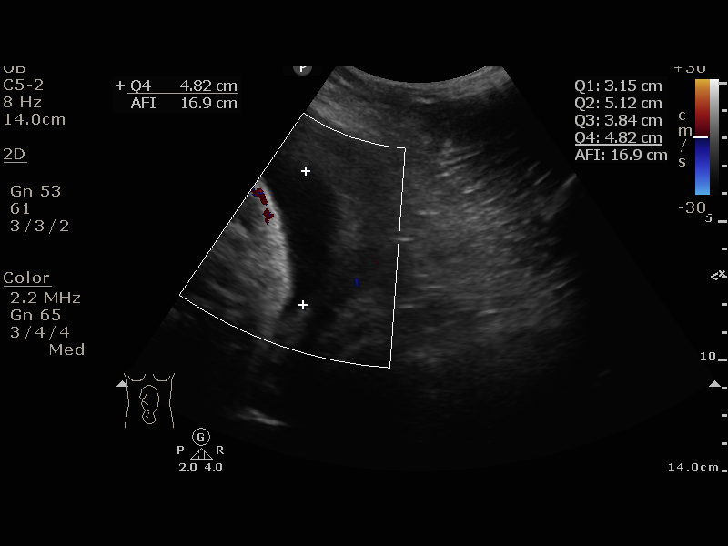

[12 of 12 positions shown; findings below may reference images not displayed]

Suite [REDACTED]

  1  US FETAL BPP W/NONSTRESS              76818.4      GEETABEN STRIKER
 ----------------------------------------------------------------------

 ----------------------------------------------------------------------
Indications

  37 weeks gestation of pregnancy
  Pre-existing essential hypertension
  complicating pregnancy, third trimester
  Gestational diabetes in pregnancy, diet
  controlled
  Advanced maternal age multigravida 35+, third
  trimester
 ----------------------------------------------------------------------
Vital Signs

                                                 Height:        5'0"
Fetal Evaluation

 Num Of Fetuses:          1
 Preg. Location:          Intrauterine
 Cardiac Activity:        Observed
 Fetal Lie:               Maternal left side
 Presentation:            Cephalic

 Amniotic Fluid
 AFI FV:      Within normal limits

 AFI Sum(cm)     %Tile       Largest Pocket(cm)
 16.93           64

 RUQ(cm)       RLQ(cm)        LUQ(cm)        LLQ(cm)

Biophysical Evaluation
 Amniotic F.V:   Pocket => 2 cm              F. Tone:         Not Observed
 F. Movement:    Observed                    Score:           [DATE]
 F. Breathing:   Observed
OB History

 Gravidity:     5         Term:  3          SAB:   1
 Living:        3
Gestational Age

 LMP:            37w 1d       Date:  05/24/18                   EDD:  02/28/19
 Best:           37w 1d    Det. By:  LMP  (05/24/18)            EDD:  02/28/19
Impression

 BPP [DATE], -2 for tone
Recommendations

 Given age and BPP, to L&D for non-urgent induction.

## 2021-08-14 ENCOUNTER — Other Ambulatory Visit: Payer: Self-pay

## 2021-08-18 ENCOUNTER — Other Ambulatory Visit: Payer: Self-pay

## 2021-09-18 ENCOUNTER — Other Ambulatory Visit: Payer: Self-pay

## 2021-10-02 ENCOUNTER — Ambulatory Visit: Payer: Self-pay | Admitting: Family Medicine

## 2021-10-20 ENCOUNTER — Other Ambulatory Visit: Payer: Self-pay

## 2021-11-19 ENCOUNTER — Other Ambulatory Visit: Payer: Self-pay

## 2021-11-20 ENCOUNTER — Other Ambulatory Visit: Payer: Self-pay

## 2021-12-25 ENCOUNTER — Other Ambulatory Visit: Payer: Self-pay

## 2021-12-26 ENCOUNTER — Other Ambulatory Visit: Payer: Self-pay

## 2022-01-19 ENCOUNTER — Other Ambulatory Visit: Payer: Self-pay | Admitting: Family Medicine

## 2022-01-19 ENCOUNTER — Other Ambulatory Visit: Payer: Self-pay

## 2022-01-19 DIAGNOSIS — E038 Other specified hypothyroidism: Secondary | ICD-10-CM

## 2022-01-19 MED ORDER — LEVOTHYROXINE SODIUM 50 MCG PO TABS
50.0000 ug | ORAL_TABLET | Freq: Every day | ORAL | 0 refills | Status: DC
  Filled 2022-01-19: qty 30, 30d supply, fill #0

## 2022-01-20 ENCOUNTER — Other Ambulatory Visit: Payer: Self-pay

## 2022-01-21 ENCOUNTER — Other Ambulatory Visit: Payer: Self-pay

## 2022-01-22 ENCOUNTER — Other Ambulatory Visit: Payer: Self-pay

## 2022-02-02 ENCOUNTER — Other Ambulatory Visit (HOSPITAL_COMMUNITY): Payer: Self-pay

## 2022-02-24 ENCOUNTER — Other Ambulatory Visit: Payer: Self-pay | Admitting: Obstetrics & Gynecology

## 2022-02-24 DIAGNOSIS — Z1231 Encounter for screening mammogram for malignant neoplasm of breast: Secondary | ICD-10-CM

## 2022-03-04 ENCOUNTER — Other Ambulatory Visit: Payer: Self-pay

## 2022-03-04 ENCOUNTER — Telehealth: Payer: Self-pay | Admitting: Family Medicine

## 2022-03-04 ENCOUNTER — Other Ambulatory Visit: Payer: Self-pay | Admitting: Pharmacist

## 2022-03-04 DIAGNOSIS — E038 Other specified hypothyroidism: Secondary | ICD-10-CM

## 2022-03-04 MED ORDER — LEVOTHYROXINE SODIUM 50 MCG PO TABS
50.0000 ug | ORAL_TABLET | Freq: Every day | ORAL | 0 refills | Status: DC
  Filled 2022-03-04: qty 30, 30d supply, fill #0

## 2022-03-04 NOTE — Addendum Note (Signed)
Addended by: Hoy Register on: 03/04/2022 04:55 PM   Modules accepted: Orders

## 2022-03-04 NOTE — Telephone Encounter (Signed)
Thank you for sending in the refill.  I have ordered labs for her and she can have this done prior to her next visit.  Thank you

## 2022-03-04 NOTE — Telephone Encounter (Signed)
Dr. Alvis Lemmings,   Her labs are almost a year old. She doesn't have an appt until Feb. I sent in a 30 day supply, but would be willing to put in thyroid labs for her to come in and have this done before your appt?

## 2022-03-04 NOTE — Telephone Encounter (Signed)
Requested medication (s) are due for refill today: yes  Requested medication (s) are on the active medication list: yes  Last refill:  01/19/22  Future visit scheduled: no  Notes to clinic:  Unable to refill per protocol, courtesy refill already given, routing for provider approval.      Requested Prescriptions  Pending Prescriptions Disp Refills   levothyroxine (SYNTHROID) 50 MCG tablet 30 tablet 0    Sig: Take 1 tablet (50 mcg total) by mouth daily before breakfast.     Endocrinology:  Hypothyroid Agents Failed - 03/04/2022  9:41 AM      Failed - Valid encounter within last 12 months    Recent Outpatient Visits           11 months ago Essential hypertension   [facility] [place], Odette Horns, MD   1 year ago Other specified hypothyroidism   [facility] Hoy Register, MD   2 years ago Acute ear pain, bilateral   [place] [facility] [place], [place] J, NP   2 years ago Acute otitis externa of both ears, unspecified type   [place] [facility] [place], [place], NP   2 years ago Other specified hypothyroidism   [facility] [place], [place], MD              Passed - TSH in normal range and within 360 days    TSH  Date Value Ref Range Status  04/04/2021 3.770 0.450 - 4.500 uIU/mL Final

## 2022-03-04 NOTE — Telephone Encounter (Signed)
Patient now has appt for 02/20 for refill of   levothyroxine (SYNTHROID) 50 MCG tablet

## 2022-03-09 ENCOUNTER — Other Ambulatory Visit: Payer: Self-pay

## 2022-03-23 ENCOUNTER — Other Ambulatory Visit: Payer: Self-pay

## 2022-03-24 ENCOUNTER — Other Ambulatory Visit: Payer: Self-pay

## 2022-03-25 ENCOUNTER — Telehealth: Payer: Self-pay

## 2022-03-25 NOTE — Telephone Encounter (Signed)
Telephoned patient at [phone], voice mail not set up. Telephoned (daughter) [phone], left voice message with [facility] (scholarship) call back information. Natale Lay (interpreter)

## 2022-04-20 ENCOUNTER — Other Ambulatory Visit: Payer: Self-pay

## 2022-04-20 ENCOUNTER — Other Ambulatory Visit: Payer: Self-pay | Admitting: Family Medicine

## 2022-04-20 DIAGNOSIS — E038 Other specified hypothyroidism: Secondary | ICD-10-CM

## 2022-04-21 ENCOUNTER — Other Ambulatory Visit (HOSPITAL_COMMUNITY): Payer: Self-pay

## 2022-04-21 NOTE — Telephone Encounter (Signed)
Requested medications are due for refill today.  yes  Requested medications are on the active medications list.  yes  Last refill. 03/04/2022 #30 0 rf  Future visit scheduled.   yes  Notes to clinic.  Labs are expired.    Requested Prescriptions  Pending Prescriptions Disp Refills   levothyroxine (SYNTHROID) 50 MCG tablet 30 tablet 0    Sig: Take 1 tablet (50 mcg total) by mouth daily before breakfast.     Endocrinology:  Hypothyroid Agents Failed - 04/20/2022  1:59 PM      Failed - TSH in normal range and within 360 days    TSH  Date Value Ref Range Status  04/04/2021 3.770 0.450 - 4.500 uIU/mL Final         Failed - Valid encounter within last 12 months    Recent Outpatient Visits           1 year ago Essential hypertension   [place], En[place]ong, MD   2 years ago Other specified hypothyroidism   [place], Enobong, MD   2 years ago Acute ear pain, bilateral   [place], [place] J, NP   2 years ago Acute otitis externa of both ears, unspecified type   [place], [place], NP   2 years ago Other specified hypothyroidism   [place], MD       Future Appointments             In 1 month [name], MD [place]

## 2022-04-22 ENCOUNTER — Other Ambulatory Visit: Payer: Self-pay | Admitting: Family Medicine

## 2022-04-22 ENCOUNTER — Other Ambulatory Visit: Payer: Self-pay

## 2022-04-22 DIAGNOSIS — E038 Other specified hypothyroidism: Secondary | ICD-10-CM

## 2022-04-22 MED ORDER — LEVOTHYROXINE SODIUM 50 MCG PO TABS
50.0000 ug | ORAL_TABLET | Freq: Every day | ORAL | 0 refills | Status: DC
  Filled 2022-04-22: qty 30, 30d supply, fill #0

## 2022-04-22 NOTE — Telephone Encounter (Signed)
Courtesy refill. Future visit in 1 month.  Requested Prescriptions  Pending Prescriptions Disp Refills   levothyroxine (SYNTHROID) 50 MCG tablet 30 tablet 0    Sig: Take 1 tablet (50 mcg total) by mouth daily before breakfast.     Endocrinology:  Hypothyroid Agents Failed - 04/22/2022  4:21 PM      Failed - TSH in normal range and within 360 days    TSH  Date Value Ref Range Status  04/04/2021 3.770 0.450 - 4.500 uIU/mL Final         Failed - Valid encounter within last 12 months    Recent Outpatient Visits           1 year ago Essential hypertension   [place], Enobong, MD   2 years ago Other specified hypothyroidism   [place], Enobong, MD   2 years ago Acute ear pain, bilateral   [place], [place] J, NP   2 years ago Acute otitis externa of both ears, unspecified type   [place], [place], NP   2 years ago Other specified hypothyroidism   [place], MD       Future Appointments             In 1 month [name], MD [place]

## 2022-04-23 ENCOUNTER — Other Ambulatory Visit: Payer: Self-pay

## 2022-05-19 ENCOUNTER — Other Ambulatory Visit: Payer: Self-pay | Admitting: Family Medicine

## 2022-05-19 DIAGNOSIS — E038 Other specified hypothyroidism: Secondary | ICD-10-CM

## 2022-05-19 DIAGNOSIS — I10 Essential (primary) hypertension: Secondary | ICD-10-CM

## 2022-05-19 NOTE — Telephone Encounter (Signed)
Medication Refill - Medication: levothyroxine (SYNTHROID) 50 MCG tablet [[id number]]/ pt  has an appt scheduled for 2.20.24/ please advise   labetalol (NORMODYNE) 100 MG tablet [[id number]]  ENDED    Has the patient contacted their pharmacy? Yes.   (Agent: If no, request that the patient contact the pharmacy for the refill. If patient does not wish to contact the pharmacy document the reason why and proceed with request.) (Agent: If yes, when and what did the pharmacy advise?)  Preferred Pharmacy (with phone number or street name): [facility]  Has the patient been seen for an appointment in the last year OR does the patient have an upcoming appointment? Yes.    Agent: Please be advised that RX refills may take up to 3 business days. We ask that you follow-up with your pharmacy.

## 2022-05-20 MED ORDER — LEVOTHYROXINE SODIUM 50 MCG PO TABS
50.0000 ug | ORAL_TABLET | Freq: Every day | ORAL | 0 refills | Status: DC
  Filled 2022-05-20: qty 30, 30d supply, fill #0

## 2022-05-20 MED ORDER — LABETALOL HCL 100 MG PO TABS
ORAL_TABLET | Freq: Two times a day (BID) | ORAL | 0 refills | Status: DC
  Filled 2022-05-20: qty 180, 90d supply, fill #0

## 2022-05-20 NOTE — Telephone Encounter (Signed)
Requested medication (s) are due for refill today: yes  Requested medication (s) are on the active medication list: yes  Last refill:  labetalol 04/28/21 #180/1, levothyroxine 04/22/22 #30/0  Future visit scheduled: yes 06/02/22  Notes to clinic:  pt is due for OV and updated labs, has upcoming appt but will be out of medications.    Requested Prescriptions  Pending Prescriptions Disp Refills   labetalol (NORMODYNE) 100 MG tablet 180 tablet 1    Sig: TAKE 1 TABLET (100 MG TOTAL) BY MOUTH 2 (TWO) TIMES DAILY.     Cardiovascular:  Beta Blockers Failed - 05/19/2022  3:21 PM      Failed - Last BP in normal range    BP Readings from Last 1 Encounters:  09/05/19 (!) 152/96         Failed - Valid encounter within last 6 months    Recent Outpatient Visits           1 year ago Essential hypertension   [place], MD   2 years ago Other specified hypothyroidism   [place], Enobong, MD   2 years ago Acute ear pain, bilateral   [place], [place] J, NP   2 years ago Acute otitis externa of both ears, unspecified type   [place], [place] J, NP   2 years ago Other specified hypothyroidism   [place], Charlane Ferretti, MD       Future Appointments             In 1 week [name], MD [place]            Passed - Last Heart Rate in normal range    Pulse Readings from Last 1 Encounters:  09/05/19 99          levothyroxine (SYNTHROID) 50 MCG tablet 30 tablet 0    Sig: Take 1 tablet (50 mcg total) by mouth daily before breakfast. (Must have office visit for refills)     Endocrinology:  Hypothyroid Agents Failed - 05/19/2022  3:21 PM      Failed - TSH in normal range and within 360 days    TSH  Date Value Ref Range Status   04/04/2021 3.770 0.450 - 4.500 uIU/mL Final         Failed - Valid encounter within last 12 months    Recent Outpatient Visits           1 year ago Essential hypertension   [place], Enobong, MD   2 years ago Other specified hypothyroidism   [place], Enobong, MD   2 years ago Acute ear pain, bilateral   [place], [place] J, NP   2 years ago Acute otitis externa of both ears, unspecified type   [place], [place] J, NP   2 years ago Other specified hypothyroidism   [place], Enobong, MD       Future Appointments             In 1 week [name], MD [place]

## 2022-05-21 ENCOUNTER — Other Ambulatory Visit: Payer: Self-pay

## 2022-05-22 ENCOUNTER — Other Ambulatory Visit: Payer: Self-pay

## 2022-05-25 ENCOUNTER — Other Ambulatory Visit: Payer: Self-pay

## 2022-06-02 ENCOUNTER — Encounter: Payer: Self-pay | Admitting: Family Medicine

## 2022-06-02 ENCOUNTER — Ambulatory Visit: Payer: Self-pay | Attending: Family Medicine | Admitting: Family Medicine

## 2022-06-02 ENCOUNTER — Telehealth: Payer: Self-pay | Admitting: Family Medicine

## 2022-06-02 DIAGNOSIS — E038 Other specified hypothyroidism: Secondary | ICD-10-CM

## 2022-06-02 DIAGNOSIS — Z13228 Encounter for screening for other metabolic disorders: Secondary | ICD-10-CM

## 2022-06-02 NOTE — Progress Notes (Signed)
Virtual Visit via Telephone Note  I connected with Sharon Brennan, on 06/02/2022 at 11:15 AM by telephone and verified that I am speaking with the correct person using two identifiers.   Consent: I discussed the limitations, risks, security and privacy concerns of performing an evaluation and management service by telephone and the availability of in person appointments. I also discussed with the patient that there may be a patient responsible charge related to this service. The patient expressed understanding and agreed to proceed.   Location of Patient: Home  Location of Provider: Clinic   Persons participating in Telemedicine visit: Luann Araiza Dr. Margarita Rana     History of Present Illness: Sharon Brennan is a 47 y.o. year old female with a history of hypertension, hypothyroidism who presents today for a follow-up visit.  Last visit was in 03/2021.  States her mother was sick and so she could not make it to the clinic for an appointment. She endorses adherence with her antihypertensive and levothyroxine and did not run out of any of her medications. She has no additional concerns today. BP was 128/88.  Past Medical History:  Diagnosis Date   Gestational diabetes    Hypertension    Hypothyroidism    Allergies  Allergen Reactions   Pollen Extract     Current Outpatient Medications on File Prior to Visit  Medication Sig Dispense Refill   Acetaminophen 325 MG CAPS Take 1 tablet by mouth as needed.      ibuprofen (ADVIL) 600 MG tablet Take 1 tablet (600 mg total) by mouth every 6 (six) hours as needed. (Patient not taking: No sig reported) 90 tablet 0   labetalol (NORMODYNE) 100 MG tablet TAKE 1 TABLET (100 MG TOTAL) BY MOUTH 2 (TWO) TIMES DAILY. 180 tablet 0   levothyroxine (SYNTHROID) 50 MCG tablet Take 1 tablet (50 mcg total) by mouth daily before breakfast. (Must have office visit for refills) 30 tablet 0   Prenatal Vit-Fe Fumarate-FA (PRENATAL  VITAMIN) 27-0.8 MG TABS Take 1 tablet by mouth daily. (Patient not taking: No sig reported) 30 tablet 11   No current facility-administered medications on file prior to visit.    ROS: See HPI  Observations/Objective: Awake, alert, oriented x3 Not in acute distress Normal mood      Latest Ref Rng & Units 04/04/2021    9:59 AM 04/17/2020    9:50 AM 06/14/2019   11:26 AM  CMP  Glucose 70 - 99 mg/dL 94  111  112   BUN 6 - 24 mg/dL 15  16  16   $ Creatinine 0.57 - 1.00 mg/dL 0.51  0.57  0.62   Sodium 134 - 144 mmol/L 138  141  140   Potassium 3.5 - 5.2 mmol/L 4.0  4.4  4.2   Chloride 96 - 106 mmol/L 98  105  101   CO2 20 - 29 mmol/L 24  20  20   $ Calcium 8.7 - 10.2 mg/dL 9.2  9.2  9.9   Total Protein 6.0 - 8.5 g/dL 7.7     Total Bilirubin 0.0 - 1.2 mg/dL 0.3     Alkaline Phos 44 - 121 IU/L 130     AST 0 - 40 IU/L 17     ALT 0 - 32 IU/L 15       Lipid Panel     Component Value Date/Time   CHOL 187 04/04/2021 0959   TRIG 53 04/04/2021 0959   HDL 82 04/04/2021 0959   CHOLHDL 2.4 06/14/2019  1126   CHOLHDL 3.0 03/04/2016 0939   VLDL 19 03/04/2016 0939   LDLCALC 95 04/04/2021 0959   LABVLDL 10 04/04/2021 0959    Lab Results  Component Value Date   HGBA1C 5.8 (H) 04/04/2021    Lab Results  Component Value Date   TSH 3.770 04/04/2021    Assessment and Plan: 1. Other specified hypothyroidism Controlled She is over due for thyroid labs but has been adherent with her levothyroxine Will send off thyroid labs and adjust regimen accordingly - T4, free; Future - TSH; Future - T3; Future  2. Screening for metabolic disorder - LP+Non-HDL Cholesterol; Future - CMP14+EGFR; Future - CBC with Differential/Platelet; Future - Hemoglobin A1c; Future   Follow Up Instructions: 6 months   I discussed the assessment and treatment plan with the patient. The patient was provided an opportunity to ask questions and all were answered. The patient agreed with the plan and demonstrated  an understanding of the instructions.   The patient was advised to call back or seek an in-person evaluation if the symptoms worsen or if the condition fails to improve as anticipated.     I provided 17 minutes total of non-face-to-face time during this encounter.   [name], MD, FAAFP. [facility] [place], [place]   06/02/2022, 11:15 AM

## 2022-06-03 ENCOUNTER — Ambulatory Visit: Payer: Self-pay | Attending: Family Medicine

## 2022-06-03 DIAGNOSIS — E038 Other specified hypothyroidism: Secondary | ICD-10-CM

## 2022-06-03 DIAGNOSIS — Z13228 Encounter for screening for other metabolic disorders: Secondary | ICD-10-CM

## 2022-06-04 ENCOUNTER — Other Ambulatory Visit: Payer: Self-pay | Admitting: Family Medicine

## 2022-06-04 DIAGNOSIS — E038 Other specified hypothyroidism: Secondary | ICD-10-CM

## 2022-06-04 LAB — CBC WITH DIFFERENTIAL/PLATELET
Basophils Absolute: 0.1 10*3/uL (ref 0.0–0.2)
Basos: 1 %
EOS (ABSOLUTE): 0.2 10*3/uL (ref 0.0–0.4)
Eos: 2 %
Hematocrit: 43.2 % (ref 34.0–46.6)
Hemoglobin: 14.5 g/dL (ref 11.1–15.9)
Immature Grans (Abs): 0 10*3/uL (ref 0.0–0.1)
Immature Granulocytes: 0 %
Lymphocytes Absolute: 2.5 10*3/uL (ref 0.7–3.1)
Lymphs: 20 %
MCH: 30.1 pg (ref 26.6–33.0)
MCHC: 33.6 g/dL (ref 31.5–35.7)
MCV: 90 fL (ref 79–97)
Monocytes Absolute: 0.8 10*3/uL (ref 0.1–0.9)
Monocytes: 6 %
Neutrophils Absolute: 8.8 10*3/uL — ABNORMAL HIGH (ref 1.4–7.0)
Neutrophils: 71 %
Platelets: 259 10*3/uL (ref 150–450)
RBC: 4.81 x10E6/uL (ref 3.77–5.28)
RDW: 12.3 % (ref 11.7–15.4)
WBC: 12.4 10*3/uL — ABNORMAL HIGH (ref 3.4–10.8)

## 2022-06-04 LAB — CMP14+EGFR
ALT: 19 IU/L (ref 0–32)
AST: 18 IU/L (ref 0–40)
Albumin/Globulin Ratio: 1.8 (ref 1.2–2.2)
Albumin: 4.9 g/dL (ref 3.9–4.9)
Alkaline Phosphatase: 124 IU/L — ABNORMAL HIGH (ref 44–121)
BUN/Creatinine Ratio: 21 (ref 9–23)
BUN: 10 mg/dL (ref 6–24)
Bilirubin Total: 0.3 mg/dL (ref 0.0–1.2)
CO2: 23 mmol/L (ref 20–29)
Calcium: 9.5 mg/dL (ref 8.7–10.2)
Chloride: 101 mmol/L (ref 96–106)
Creatinine, Ser: 0.47 mg/dL — ABNORMAL LOW (ref 0.57–1.00)
Globulin, Total: 2.7 g/dL (ref 1.5–4.5)
Glucose: 111 mg/dL — ABNORMAL HIGH (ref 70–99)
Potassium: 4.3 mmol/L (ref 3.5–5.2)
Sodium: 140 mmol/L (ref 134–144)
Total Protein: 7.6 g/dL (ref 6.0–8.5)
eGFR: 119 mL/min/{1.73_m2} (ref >59)

## 2022-06-04 LAB — HEMOGLOBIN A1C
Est. average glucose Bld gHb Est-mCnc: 123 mg/dL
Hgb A1c MFr Bld: 5.9 % — ABNORMAL HIGH (ref 4.8–5.6)

## 2022-06-04 LAB — LP+NON-HDL CHOLESTEROL
Cholesterol, Total: 172 mg/dL (ref 100–199)
HDL: 71 mg/dL (ref >39)
LDL Chol Calc (NIH): 88 mg/dL (ref 0–99)
Total Non-HDL-Chol (LDL+VLDL): 101 mg/dL (ref 0–129)
Triglycerides: 71 mg/dL (ref 0–149)
VLDL Cholesterol Cal: 13 mg/dL (ref 5–40)

## 2022-06-04 LAB — TSH: TSH: 3.62 u[IU]/mL (ref 0.450–4.500)

## 2022-06-04 LAB — T4, FREE: Free T4: 1.55 ng/dL (ref 0.82–1.77)

## 2022-06-04 LAB — T3: T3, Total: 132 ng/dL (ref 71–180)

## 2022-06-04 MED ORDER — LEVOTHYROXINE SODIUM 50 MCG PO TABS
50.0000 ug | ORAL_TABLET | Freq: Every day | ORAL | 1 refills | Status: DC
  Filled 2022-06-04: qty 90, 90d supply, fill #0
  Filled 2022-07-09: qty 30, 30d supply, fill #0
  Filled 2022-08-13: qty 30, 30d supply, fill #1
  Filled 2022-09-24: qty 30, 30d supply, fill #2
  Filled 2022-10-29: qty 30, 30d supply, fill #3
  Filled 2022-12-02: qty 30, 30d supply, fill #4
  Filled 2023-01-12: qty 30, 30d supply, fill #5

## 2022-06-05 ENCOUNTER — Other Ambulatory Visit: Payer: Self-pay

## 2022-07-09 ENCOUNTER — Other Ambulatory Visit: Payer: Self-pay

## 2022-07-10 ENCOUNTER — Other Ambulatory Visit: Payer: Self-pay

## 2022-08-13 ENCOUNTER — Other Ambulatory Visit: Payer: Self-pay

## 2022-08-13 ENCOUNTER — Other Ambulatory Visit (HOSPITAL_COMMUNITY): Payer: Self-pay

## 2022-08-17 ENCOUNTER — Other Ambulatory Visit: Payer: Self-pay

## 2022-09-24 ENCOUNTER — Other Ambulatory Visit: Payer: Self-pay

## 2022-10-29 ENCOUNTER — Other Ambulatory Visit (HOSPITAL_COMMUNITY): Payer: Self-pay

## 2022-10-29 ENCOUNTER — Other Ambulatory Visit: Payer: Self-pay

## 2022-10-30 ENCOUNTER — Other Ambulatory Visit: Payer: Self-pay

## 2022-12-01 ENCOUNTER — Ambulatory Visit: Payer: Self-pay | Admitting: Family Medicine

## 2022-12-02 ENCOUNTER — Other Ambulatory Visit: Payer: Self-pay

## 2022-12-07 ENCOUNTER — Ambulatory Visit: Payer: Self-pay | Attending: Family Medicine | Admitting: Family Medicine

## 2022-12-24 ENCOUNTER — Other Ambulatory Visit: Payer: Self-pay

## 2022-12-24 ENCOUNTER — Other Ambulatory Visit: Payer: Self-pay | Admitting: Family Medicine

## 2022-12-24 DIAGNOSIS — I10 Essential (primary) hypertension: Secondary | ICD-10-CM

## 2022-12-24 MED ORDER — LABETALOL HCL 100 MG PO TABS
100.0000 mg | ORAL_TABLET | Freq: Two times a day (BID) | ORAL | 0 refills | Status: DC
  Filled 2022-12-24: qty 60, 30d supply, fill #0

## 2023-01-12 ENCOUNTER — Other Ambulatory Visit: Payer: Self-pay

## 2023-01-13 ENCOUNTER — Other Ambulatory Visit: Payer: Self-pay

## 2023-02-16 ENCOUNTER — Other Ambulatory Visit: Payer: Self-pay | Admitting: Family Medicine

## 2023-02-16 ENCOUNTER — Other Ambulatory Visit: Payer: Self-pay

## 2023-02-16 DIAGNOSIS — I10 Essential (primary) hypertension: Secondary | ICD-10-CM

## 2023-02-16 DIAGNOSIS — E038 Other specified hypothyroidism: Secondary | ICD-10-CM

## 2023-02-17 ENCOUNTER — Other Ambulatory Visit: Payer: Self-pay

## 2023-02-17 MED ORDER — LABETALOL HCL 100 MG PO TABS
100.0000 mg | ORAL_TABLET | Freq: Two times a day (BID) | ORAL | 0 refills | Status: DC
  Filled 2023-02-17: qty 60, 30d supply, fill #0

## 2023-02-17 MED ORDER — LEVOTHYROXINE SODIUM 50 MCG PO TABS
50.0000 ug | ORAL_TABLET | Freq: Every day | ORAL | 0 refills | Status: DC
  Filled 2023-02-17: qty 30, 30d supply, fill #0

## 2023-02-19 ENCOUNTER — Other Ambulatory Visit: Payer: Self-pay

## 2023-02-22 ENCOUNTER — Other Ambulatory Visit: Payer: Self-pay

## 2023-03-03 ENCOUNTER — Ambulatory Visit: Payer: Self-pay | Admitting: Family Medicine

## 2023-03-04 ENCOUNTER — Ambulatory Visit: Payer: Self-pay | Attending: Family Medicine | Admitting: Family Medicine

## 2023-03-04 ENCOUNTER — Encounter: Payer: Self-pay | Admitting: Family Medicine

## 2023-03-04 ENCOUNTER — Other Ambulatory Visit: Payer: Self-pay

## 2023-03-04 VITALS — BP 164/90 | HR 100 | Ht 59.0 in | Wt 160.0 lb

## 2023-03-04 DIAGNOSIS — Z1211 Encounter for screening for malignant neoplasm of colon: Secondary | ICD-10-CM

## 2023-03-04 DIAGNOSIS — E038 Other specified hypothyroidism: Secondary | ICD-10-CM

## 2023-03-04 DIAGNOSIS — I10 Essential (primary) hypertension: Secondary | ICD-10-CM

## 2023-03-04 DIAGNOSIS — R7303 Prediabetes: Secondary | ICD-10-CM

## 2023-03-04 MED ORDER — LABETALOL HCL 100 MG PO TABS
100.0000 mg | ORAL_TABLET | Freq: Two times a day (BID) | ORAL | 1 refills | Status: DC
  Filled 2023-03-04 – 2023-04-01 (×2): qty 60, 30d supply, fill #0
  Filled 2023-07-05 (×2): qty 60, 30d supply, fill #1

## 2023-03-04 NOTE — Patient Instructions (Signed)
Cmo controlar su hipertensin Managing Your Hypertension La hipertensin, tambin conocida como presin arterial alta, se produce cuando la sangre ejerce presin contra las paredes de las arterias con demasiada fuerza. Las arterias son los vasos sanguneos que transportan la sangre desde el corazn hacia todas las partes del cuerpo. La hipertensin hace que el corazn haga ms esfuerzo para bombear la sangre y puede provocar que las arterias se estrechen o endurezcan. Qu significan las lecturas de la presin arterial Una lectura de la presin arterial consta de un nmero ms alto sobre un nmero ms bajo. El primer nmero, o nmero superior, es la presin sistlica. Es la medida de la presin de las arterias cuando el corazn late. El segundo nmero, o nmero inferior, es la presin diastlica. Es la medida de la presin en las arterias cuando el corazn se relaja. Para la mayora de las personas, una presin arterial normal est por debajo de 120/80. La presin arterial deseada puede variar en funcin de las enfermedades, la edad y otros factores personales. La presin arterial se clasifica en cuatro etapas. Sobre la base de la lectura de su presin arterial, el mdico puede usar las siguientes etapas para determinar si necesita tratamiento y de qu tipo. La presin sistlica y la presin diastlica se miden en una unidad llamada milmetros de mercurio (mm Hg). Normal Presin sistlica: por debajo de 120. Presin diastlica: por debajo de 80. Elevada Presin sistlica: 120-129. Presin diastlica: por debajo de 80. Etapa 1 de hipertensin Presin sistlica: 130-139. Presin diastlica: 80-89. Etapa 2 de hipertensin Presin sistlica: 140 o ms. Presin diastlica: 90 o ms. Cmo puede afectarme esta enfermedad? Controlar la hipertensin es muy importante. Con el transcurso del tiempo, la hipertensin puede daar las arterias y disminuir el flujo de sangre hacia partes del cuerpo que  incluyen el cerebro, el corazn y los riones. Tener hipertensin no tratada o no controlada puede causar: Infarto de miocardio. Accidente cerebrovascular. Debilitamiento de los vasos sanguneos (aneurisma). Insuficiencia cardaca. Dao renal. Dao ocular. Problemas de memoria y concentracin. Demencia vascular. Qu medidas puedo tomar para controlar esta afeccin? La hipertensin se puede controlar haciendo cambios en el estilo de vida y, posiblemente, tomando medicamentos. Su mdico le ayudar a crear un plan para bajar la presin arterial al rango normal. Es posible que lo deriven para que reciba asesoramiento sobre una dieta saludable y actividad fsica. Nutricin  Siga una dieta con alto contenido de fibras y potasio, y con bajo contenido de sal (sodio), azcar agregada y grasas. Un ejemplo de plan de alimentacin se denomina dieta DASH. DASH es la sigla en ingls de "Enfoques alimentarios para detener la hipertensin". Para alimentarse de esta manera: Coma mucha fruta y verdura fresca. Trate de que la mitad del plato de cada comida sea de frutas y verduras. Coma cereales integrales, como pasta integral, arroz integral o pan integral. Llene aproximadamente un cuarto del plato con cereales integrales. Consuma productos lcteos descremados. Evite la ingesta de cortes de carne grasa, carne procesada o curada, y carne de ave con piel. Llene aproximadamente un cuarto del plato con protenas magras, como pescado, pollo sin piel, frijoles, huevos o tofu. Evite ingerir alimentos prehechos y procesados. En general, estos tienen mayor cantidad de sodio, azcar agregada y grasa. Reduzca su ingesta diaria de sodio. Muchas personas que tienen hipertensin deben comer menos de 1500 mg de sodio por da. Estilo de vida  Trabaje con su mdico para mantener un peso saludable o perder peso. Pregntele cul es el peso recomendado   para usted. Realice al menos 30 minutos de ejercicio que haga que se acelere  su corazn (ejercicio aerbico) la mayora de los das de la semana. Estas actividades pueden incluir caminar, nadar o andar en bicicleta. Incluya ejercicios para fortalecer sus msculos (ejercicios de resistencia), como levantamiento de pesas, como parte de su rutina semanal de ejercicios. Intente realizar 30 minutos de este tipo de ejercicios al menos tres das a la semana. No consuma ningn producto que contenga nicotina o tabaco. Estos productos incluyen cigarrillos, tabaco para mascar y aparatos de vapeo, como los cigarrillos electrnicos. Si necesita ayuda para dejar de consumir estos productos, consulte al mdico. Controle las enfermedades a largo plazo (crnicas), como el colesterol alto o la diabetes. Identifique sus causas de estrs y encuentre maneras de controlar el estrs. Esto puede incluir meditacin, respiracin profunda o hacerse tiempo para realizar actividades divertidas. Consumo de alcohol No beba alcohol si: Su mdico le indica no hacerlo. Est embarazada, puede estar embarazada o est tratando de quedar embarazada. Si bebe alcohol: Limite la cantidad que bebe a lo siguiente: De 0 a 1 medida por da para las mujeres. De 0 a 2 medidas por da para los hombres. Sepa cunta cantidad de alcohol hay en las bebidas que toma. En los [place], una medida equivale a una botella de cerveza de 12 oz (355 ml), un vaso de vino de 5 oz (148 ml) o un vaso de una bebida alcohlica de alta graduacin de 1 oz (44 ml). Medicamentos El mdico puede recetarle medicamentos si los cambios en el estilo de vida no son suficientes para lograr controlar la presin arterial y si: Su presin arterial sistlica es de 130 o ms. Su presin arterial diastlica es de 80 o ms. Use los medicamentos solamente como se lo haya indicado el mdico. Siga cuidadosamente las indicaciones. Los medicamentos para la presin arterial deben tomarse como se lo haya indicado el mdico. Los medicamentos pierden eficacia  al omitir las dosis. El hecho de omitir las dosis tambin aumenta el riesgo de otros problemas. Monitoreo Antes de controlarse la presin arterial: No fume, no consuma bebidas con cafena ni haga ejercicio dentro de los 30 minutos antes de tomar la medicin. Vaya al bao y vace la vejiga (orine). Permanezca sentado tranquilamente durante al menos 5 minutos antes de tomar las mediciones. Contrlese la presin arterial en su casa segn las indicaciones del mdico. Para hacer esto: Sintese con la espalda recta y con apoyo. Coloque los pies planos en el piso. No se cruce de piernas. Apoye el brazo sobre una superficie plana, como una mesa. Asegrese de que la parte superior del brazo est al nivel del corazn. Cada vez que tome una medicin, tome dos o tres lecturas con un minuto de separacin y anote los resultados. Posiblemente tambin sea necesario que el mdico le controle la presin arterial de manera regular. Informacin general Hable con su mdico acerca de la dieta, hbitos de ejercicio y otros factores del estilo de vida que pueden contribuir a la hipertensin. Revise con su mdico todos los medicamentos que toma ya que puede haber efectos secundarios o interacciones. Concurra a todas las visitas de seguimiento. El mdico puede ayudarle a crear y ajustar su plan para controlar la presin arterial alta. Dnde obtener ms informacin National Heart, Lung, and Blood Institute (Instituto Nacional del Corazn, los Pulmones y la Sangre): www.nhlbi.nih.gov American Heart Association (Asociacin Estadounidense del Corazn): www.heart.org Comunquese con un mdico si: Piensa que tiene una reaccin alrgica a los medicamentos   que ha tomado. Tiene dolores de cabeza frecuentes (recurrentes). Siente mareos. Tiene hinchazn en los tobillos. Tiene problemas de visin. Solicite ayuda de inmediato si: Siente un dolor de cabeza intenso o confusin. Siente debilidad inusual, adormecimiento o que se  desmayar. Siente un dolor intenso en el pecho o el abdomen. Vomita repetidas veces. Tiene dificultad para respirar. Estos sntomas pueden indicar una emergencia. Solicite ayuda de inmediato. Llame al 911. No espere a ver si los sntomas desaparecen. No conduzca por sus propios medios hasta el hospital. Resumen La hipertensin se produce cuando la sangre bombea en las arterias con mucha fuerza. Si esta afeccin no se controla, podra correr riesgo de tener complicaciones graves. La presin arterial deseada puede variar en funcin de las enfermedades, la edad y otros factores personales. Para la mayora de las personas, una presin arterial normal es menor que 120/80. La hipertensin se puede controlar mediante cambios en el estilo de vida, tomando medicamentos, o ambas cosas. Los cambios en el estilo de vida para ayudar a controlar la hipertensin incluyen prdida de peso, seguir una dieta saludable, con bajo contenido de sodio, hacer ms ejercicio, dejar de fumar y limitar el consumo de alcohol. Esta informacin no tiene como fin reemplazar el consejo del mdico. Asegrese de hacerle al mdico cualquier pregunta que tenga. Document Revised: 01/06/2021 Document Reviewed: 01/06/2021 Elsevier Patient Education  2024 Elsevier Inc.  

## 2023-03-04 NOTE — Progress Notes (Signed)
Subjective:  Patient ID: Sharon Brennan, female    DOB: Sep 13, 1975  Age: 47 y.o. MRN: [id number]  CC: Medical Management of Chronic Issues   HPI Sharon Brennan is a 47 y.o. year old female with a history of hypertension, hypothyroidism who presents today for a follow-up visit.   Interval History: Discussed the use of AI scribe software for clinical note transcription with the patient, who gave verbal consent to proceed.  She prediabetes, presents for a routine check-up. She reports feeling nervous about her appointment, which she believes may be contributing to her elevated blood pressure reading. She has been monitoring her blood pressure at home and reports that it is typically not high.  This morning it was 132/81.  She has been adhering to her medication regimen, which includes labetalol for her hypertension and her thyroid medication. She also mentions that she recently had a Pap smear at the health department in December 2023.. The patient is also a mother of four children, the youngest being four years old and the oldest being twenty. She reports engaging in regular exercise, specifically cardio and weight lifting, but tends to stop during her menstrual period due to discomfort and back pain.        Past Medical History:  Diagnosis Date   Gestational diabetes    Hypertension    Hypothyroidism     Past Surgical History:  Procedure Laterality Date   CESAREAN SECTION     CESAREAN SECTION      Family History  Problem Relation Age of Onset   Hypertension Mother     Social History   Socioeconomic History   Marital status: Married    Spouse name: Emeline Gins Cortez-Negrete   Number of children: 3   Years of education: Not on file   Highest education level: Not on file  Occupational History   Occupation: unemployed  Tobacco Use   Smoking status: Never   Smokeless tobacco: Never  Vaping Use   Vaping status: Never Used  Substance and Sexual Activity   Alcohol  use: No   Drug use: No   Sexual activity: Yes    Birth control/protection: None  Other Topics Concern   Not on file  Social History Narrative   ** Merged History Encounter **       Social Determinants of Health   Financial Resource Strain: Low Risk  (03/04/2023)   Overall Financial Resource Strain (CARDIA)    Difficulty of Paying Living Expenses: Not hard at all  Food Insecurity: No Food Insecurity (03/04/2023)   Hunger Vital Sign    Worried About Running Out of Food in the Last Year: Never true    Ran Out of Food in the Last Year: Never true  Transportation Needs: No Transportation Needs (03/04/2023)   PRAPARE - Administrator, Civil Service (Medical): No    Lack of Transportation (Non-Medical): No  Physical Activity: Insufficiently Active (03/04/2023)   Exercise Vital Sign    Days of Exercise per Week: 2 days    Minutes of Exercise per Session: 30 min  Stress: No Stress Concern Present (03/04/2023)   [employer] of Occupational Health - Occupational Stress Questionnaire    Feeling of Stress : Not at all  Social Connections: Unknown (03/04/2023)   Social Connection and Isolation Panel [NHANES]    Frequency of Communication with Friends and Family: More than three times a week    Frequency of Social Gatherings with Friends and Family: Twice a week  Attends Religious Services: Patient declined    Active Member of Clubs or Organizations: Yes    Attends Banker Meetings: Not on file    Marital Status: Married    Allergies  Allergen Reactions   Pollen Extract     Outpatient Medications Prior to Visit  Medication Sig Dispense Refill   Acetaminophen 325 MG CAPS Take 1 tablet by mouth as needed.      levothyroxine (SYNTHROID) 50 MCG tablet Take 1 tablet (50 mcg total) by mouth daily before breakfast. (Must have office visit for refills) 30 tablet 0   Prenatal Vit-Fe Fumarate-FA (PRENATAL VITAMIN) 27-0.8 MG TABS Take 1 tablet by mouth daily. 30  tablet 11   labetalol (NORMODYNE) 100 MG tablet TAKE 1 TABLET (100 MG TOTAL) BY MOUTH 2 (TWO) TIMES DAILY. (MUST HAVE OFFICE VISIT FOR FUTURE REFILLS.) 60 tablet 0   ibuprofen (ADVIL) 600 MG tablet Take 1 tablet (600 mg total) by mouth every 6 (six) hours as needed. (Patient not taking: Reported on 06/14/2019) 90 tablet 0   No facility-administered medications prior to visit.     ROS Review of Systems  Constitutional:  Negative for activity change and appetite change.  HENT:  Negative for sinus pressure and sore throat.   Respiratory:  Negative for chest tightness, shortness of breath and wheezing.   Cardiovascular:  Negative for chest pain and palpitations.  Gastrointestinal:  Negative for abdominal distention, abdominal pain and constipation.  Genitourinary: Negative.   Musculoskeletal: Negative.   Psychiatric/Behavioral:  Negative for behavioral problems and dysphoric mood.     Objective:  BP (!) 164/90   Pulse 100   Ht 4\' 11"  (1.499 m)   Wt 160 lb (72.6 kg)   SpO2 100%   BMI 32.32 kg/m      03/04/2023   10:55 AM 03/04/2023   10:24 AM 09/05/2019   11:30 AM  BP/Weight  Systolic BP 164 163 152  Diastolic BP 90 102 96  Wt. (Lbs)  160   BMI  32.32 kg/m2       Physical Exam Constitutional:      Appearance: She is well-developed.  Cardiovascular:     Rate and Rhythm: Normal rate.     Heart sounds: Normal heart sounds. No murmur heard. Pulmonary:     Effort: Pulmonary effort is normal.     Breath sounds: Normal breath sounds. No wheezing or rales.  Chest:     Chest wall: No tenderness.  Abdominal:     General: Bowel sounds are normal. There is no distension.     Palpations: Abdomen is soft. There is no mass.     Tenderness: There is no abdominal tenderness.  Musculoskeletal:        General: Normal range of motion.     Right lower leg: No edema.     Left lower leg: No edema.  Neurological:     Mental Status: She is alert and oriented to person, place, and time.   Psychiatric:        Mood and Affect: Mood normal.        Latest Ref Rng & Units 06/03/2022    9:38 AM 04/04/2021    9:59 AM 04/17/2020    9:50 AM  CMP  Glucose 70 - 99 mg/dL 782  94  956   BUN 6 - 24 mg/dL 10  15  16    Creatinine 0.57 - 1.00 mg/dL 2.13  0.86  5.78   Sodium 134 - 144 mmol/L 140  138  141   Potassium 3.5 - 5.2 mmol/L 4.3  4.0  4.4   Chloride 96 - 106 mmol/L 101  98  105   CO2 20 - 29 mmol/L 23  24  20    Calcium 8.7 - 10.2 mg/dL 9.5  9.2  9.2   Total Protein 6.0 - 8.5 g/dL 7.6  7.7    Total Bilirubin 0.0 - 1.2 mg/dL 0.3  0.3    Alkaline Phos 44 - 121 IU/L 124  130    AST 0 - 40 IU/L 18  17    ALT 0 - 32 IU/L 19  15      Lipid Panel     Component Value Date/Time   CHOL 172 06/03/2022 0938   TRIG 71 06/03/2022 0938   HDL 71 06/03/2022 0938   CHOLHDL 2.4 06/14/2019 1126   CHOLHDL 3.0 03/04/2016 0939   VLDL 19 03/04/2016 0939   LDLCALC 88 06/03/2022 0938    CBC    Component Value Date/Time   WBC 12.4 (H) 06/03/2022 0938   WBC 8.3 02/08/2019 1414   RBC 4.81 06/03/2022 0938   RBC 4.43 02/08/2019 1414   HGB 14.5 06/03/2022 0938   HGB 13.2 08/08/2018 0000   HCT 43.2 06/03/2022 0938   HCT 41 08/08/2018 0000   PLT 259 06/03/2022 0938   PLT 261 08/08/2018 0000   MCV 90 06/03/2022 0938   MCH 30.1 06/03/2022 0938   MCH 30.7 02/08/2019 1414   MCHC 33.6 06/03/2022 0938   MCHC 33.4 02/08/2019 1414   RDW 12.3 06/03/2022 0938   LYMPHSABS 2.5 06/03/2022 0938   MONOABS 0.3 11/19/2007 0500   EOSABS 0.2 06/03/2022 0938   BASOSABS 0.1 06/03/2022 6578    Lab Results  Component Value Date   HGBA1C 5.9 (H) 06/03/2022    Lab Results  Component Value Date   TSH 3.620 06/03/2022    Assessment & Plan:      Hypertension Elevated blood pressure at the clinic visit, possibly due to anxiety. Patient reports normal readings at home and is adherent to Labetalol. -Recheck blood pressure during this visit BP still elevated -Will have her bring in her blood  pressure monitor to her next visit for comparison and if BP is still elevated at next visit we will increase labetalol dose -Counseled on blood pressure goal of less than 130/80, low-sodium, DASH diet, medication compliance, 150 minutes of moderate intensity exercise per week. Discussed medication compliance, adverse effects.   Hypothyroidism Patient is adherent to Levothyroxine. Last thyroid function test was in February. -Order thyroid function tests today. -Refill Levothyroxine prescription based on the results of the thyroid function tests.  Prediabetes Last test showed prediabetes A1c 5.9.  -Order blood glucose and HbA1c tests today to monitor for progression to diabetes.  General Health Maintenance -Obtain records of the most recent Pap smear from the Health Department. -Order stool test for colon cancer screening. -Follow up with patient regarding the results of all tests.          Meds ordered this encounter  Medications   labetalol (NORMODYNE) 100 MG tablet    Sig: Take 1 tablet (100 mg total) by mouth 2 (two) times daily.    Dispense:  60 tablet    Refill:  1    Follow-up: Return in about 1 month (around 04/03/2023) for Blood Pressure follow-up with PCP.       Hoy Register, MD, FAAFP. [facility] [place], [place] [phone]  03/04/2023, 11:37 AM

## 2023-03-05 ENCOUNTER — Other Ambulatory Visit: Payer: Self-pay | Admitting: Family Medicine

## 2023-03-05 ENCOUNTER — Other Ambulatory Visit: Payer: Self-pay

## 2023-03-05 DIAGNOSIS — E038 Other specified hypothyroidism: Secondary | ICD-10-CM

## 2023-03-05 LAB — CMP14+EGFR
ALT: 31 [IU]/L (ref 0–32)
AST: 20 [IU]/L (ref 0–40)
Albumin: 4.6 g/dL (ref 3.9–4.9)
Alkaline Phosphatase: 155 [IU]/L — ABNORMAL HIGH (ref 44–121)
BUN/Creatinine Ratio: 24 — ABNORMAL HIGH (ref 9–23)
BUN: 14 mg/dL (ref 6–24)
Bilirubin Total: 0.4 mg/dL (ref 0.0–1.2)
CO2: 21 mmol/L (ref 20–29)
Calcium: 9.3 mg/dL (ref 8.7–10.2)
Chloride: 101 mmol/L (ref 96–106)
Creatinine, Ser: 0.58 mg/dL (ref 0.57–1.00)
Globulin, Total: 3.2 g/dL (ref 1.5–4.5)
Glucose: 121 mg/dL — ABNORMAL HIGH (ref 70–99)
Potassium: 4.3 mmol/L (ref 3.5–5.2)
Sodium: 138 mmol/L (ref 134–144)
Total Protein: 7.8 g/dL (ref 6.0–8.5)
eGFR: 112 mL/min/{1.73_m2} (ref >59)

## 2023-03-05 LAB — T3: T3, Total: 111 ng/dL (ref 71–180)

## 2023-03-05 LAB — TSH: TSH: 2.32 u[IU]/mL (ref 0.450–4.500)

## 2023-03-05 LAB — T4, FREE: Free T4: 1.38 ng/dL (ref 0.82–1.77)

## 2023-03-05 LAB — HEMOGLOBIN A1C
Est. average glucose Bld gHb Est-mCnc: 128 mg/dL
Hgb A1c MFr Bld: 6.1 % — ABNORMAL HIGH (ref 4.8–5.6)

## 2023-03-05 MED ORDER — LEVOTHYROXINE SODIUM 50 MCG PO TABS
50.0000 ug | ORAL_TABLET | Freq: Every day | ORAL | 6 refills | Status: DC
  Filled 2023-03-05 – 2023-04-01 (×2): qty 30, 30d supply, fill #0
  Filled 2023-05-24: qty 30, 30d supply, fill #1
  Filled 2023-07-05: qty 30, 30d supply, fill #2
  Filled 2023-08-16: qty 30, 30d supply, fill #3
  Filled 2023-09-27: qty 30, 30d supply, fill #4
  Filled 2023-11-04: qty 30, 30d supply, fill #5
  Filled 2023-12-17: qty 30, 30d supply, fill #6

## 2023-04-01 ENCOUNTER — Other Ambulatory Visit: Payer: Self-pay

## 2023-04-05 ENCOUNTER — Other Ambulatory Visit: Payer: Self-pay

## 2023-04-08 ENCOUNTER — Other Ambulatory Visit: Payer: Self-pay

## 2023-04-13 ENCOUNTER — Ambulatory Visit: Payer: Self-pay | Attending: Family Medicine | Admitting: Family Medicine

## 2023-05-11 ENCOUNTER — Ambulatory Visit: Payer: Self-pay | Admitting: Family Medicine

## 2023-05-19 ENCOUNTER — Ambulatory Visit: Payer: Self-pay | Admitting: Family Medicine

## 2023-05-24 ENCOUNTER — Other Ambulatory Visit (HOSPITAL_COMMUNITY): Payer: Self-pay

## 2023-05-24 ENCOUNTER — Other Ambulatory Visit: Payer: Self-pay

## 2023-07-05 ENCOUNTER — Other Ambulatory Visit: Payer: Self-pay

## 2023-07-06 ENCOUNTER — Other Ambulatory Visit: Payer: Self-pay

## 2023-08-16 ENCOUNTER — Other Ambulatory Visit: Payer: Self-pay

## 2023-08-16 ENCOUNTER — Other Ambulatory Visit: Payer: Self-pay | Admitting: Family Medicine

## 2023-08-16 DIAGNOSIS — I10 Essential (primary) hypertension: Secondary | ICD-10-CM

## 2023-08-16 MED ORDER — LABETALOL HCL 100 MG PO TABS
100.0000 mg | ORAL_TABLET | Freq: Two times a day (BID) | ORAL | 0 refills | Status: DC
  Filled 2023-08-16: qty 60, 30d supply, fill #0

## 2023-09-27 ENCOUNTER — Other Ambulatory Visit: Payer: Self-pay

## 2023-11-04 ENCOUNTER — Other Ambulatory Visit: Payer: Self-pay

## 2023-11-05 ENCOUNTER — Other Ambulatory Visit: Payer: Self-pay

## 2023-12-17 ENCOUNTER — Other Ambulatory Visit: Payer: Self-pay | Admitting: Family Medicine

## 2023-12-17 ENCOUNTER — Other Ambulatory Visit: Payer: Self-pay

## 2023-12-17 DIAGNOSIS — I10 Essential (primary) hypertension: Secondary | ICD-10-CM

## 2023-12-17 MED ORDER — LABETALOL HCL 100 MG PO TABS
100.0000 mg | ORAL_TABLET | Freq: Two times a day (BID) | ORAL | 0 refills | Status: AC
Start: 2023-12-17 — End: ?
  Filled 2023-12-17: qty 60, 30d supply, fill #0

## 2024-01-13 ENCOUNTER — Other Ambulatory Visit: Payer: Self-pay

## 2024-01-19 ENCOUNTER — Other Ambulatory Visit: Payer: Self-pay

## 2024-01-19 ENCOUNTER — Other Ambulatory Visit: Payer: Self-pay | Admitting: Family Medicine

## 2024-01-19 DIAGNOSIS — E038 Other specified hypothyroidism: Secondary | ICD-10-CM

## 2024-01-20 ENCOUNTER — Other Ambulatory Visit: Payer: Self-pay

## 2024-01-20 MED ORDER — LEVOTHYROXINE SODIUM 50 MCG PO TABS
50.0000 ug | ORAL_TABLET | Freq: Every day | ORAL | 0 refills | Status: DC
  Filled 2024-01-20: qty 30, 30d supply, fill #0

## 2024-01-24 ENCOUNTER — Other Ambulatory Visit: Payer: Self-pay

## 2024-01-25 ENCOUNTER — Other Ambulatory Visit: Payer: Self-pay

## 2024-03-13 ENCOUNTER — Other Ambulatory Visit: Payer: Self-pay | Admitting: Family Medicine

## 2024-03-13 ENCOUNTER — Other Ambulatory Visit: Payer: Self-pay

## 2024-03-13 DIAGNOSIS — E038 Other specified hypothyroidism: Secondary | ICD-10-CM

## 2024-03-13 DIAGNOSIS — I10 Essential (primary) hypertension: Secondary | ICD-10-CM

## 2024-03-15 ENCOUNTER — Other Ambulatory Visit (HOSPITAL_COMMUNITY): Payer: Self-pay

## 2024-03-15 ENCOUNTER — Other Ambulatory Visit: Payer: Self-pay

## 2024-03-21 ENCOUNTER — Telehealth: Payer: Self-pay | Admitting: Family Medicine

## 2024-03-21 DIAGNOSIS — I10 Essential (primary) hypertension: Secondary | ICD-10-CM

## 2024-03-21 DIAGNOSIS — E038 Other specified hypothyroidism: Secondary | ICD-10-CM

## 2024-03-21 NOTE — Telephone Encounter (Unsigned)
 Copied from CRM [id number]. Topic: Clinical - Medication Refill >> Mar 21, 2024  4:07 PM Zebedee SAUNDERS wrote: Medication: levothyroxine  (SYNTHROID ) 50 MCG tablet, labetalol  (NORMODYNE ) 100 MG tablet  Has the patient contacted their pharmacy? Yes (Agent: If no, request that the patient contact the pharmacy for the refill. If patient does not wish to contact the pharmacy document the reason why and proceed with request.) (Agent: If yes, when and what did the pharmacy advise?)  This is the patient's preferred pharmacy:  [facility] [address] [place] [address] Phone: [phone] Fax: [phone]  Is this the correct pharmacy for this prescription? Yes If no, delete pharmacy and type the correct one.   Has the prescription been filled recently? Yes  Is the patient out of the medication? Yes  Has the patient been seen for an appointment in the last year OR does the patient have an upcoming appointment? Yes  Can we respond through MyChart? No  Agent: Please be advised that Rx refills may take up to 3 business days. We ask that you follow-up with your pharmacy.

## 2024-03-21 NOTE — Telephone Encounter (Unsigned)
 Copied from CRM [id number]. Topic: Clinical - Medication Refill >> Mar 21, 2024  4:07 PM Zebedee SAUNDERS wrote: Medication: levothyroxine  (SYNTHROID ) 50 MCG tablet, labetalol  (NORMODYNE ) 100 MG tablet  Has the patient contacted their pharmacy? Yes (Agent: If no, request that the patient contact the pharmacy for the refill. If patient does not wish to contact the pharmacy document the reason why and proceed with request.) (Agent: If yes, when and what did the pharmacy advise?)  This is the patient's preferred pharmacy:  [facility] - [facility] [address] [place] [address] Phone: [phone] Fax: [phone]  Is this the correct pharmacy for this prescription? Yes If no, delete pharmacy and type the correct one.   Has the prescription been filled recently? Yes  Is the patient out of the medication? Yes  Has the patient been seen for an appointment in the last year OR does the patient have an upcoming appointment? Yes  Can we respond through MyChart? No  Agent: Please be advised that Rx refills may take up to 3 business days. We ask that you follow-up with your pharmacy. >> Mar 21, 2024  4:11 PM Zebedee SAUNDERS wrote: Pt has scheduled appt on 05/23/2024.

## 2024-03-22 ENCOUNTER — Other Ambulatory Visit: Payer: Self-pay

## 2024-03-23 ENCOUNTER — Other Ambulatory Visit: Payer: Self-pay

## 2024-03-23 MED ORDER — LABETALOL HCL 100 MG PO TABS
100.0000 mg | ORAL_TABLET | Freq: Two times a day (BID) | ORAL | 0 refills | Status: DC
  Filled 2024-03-23: qty 60, 30d supply, fill #0

## 2024-03-23 MED ORDER — LEVOTHYROXINE SODIUM 50 MCG PO TABS
50.0000 ug | ORAL_TABLET | Freq: Every day | ORAL | 0 refills | Status: DC
  Filled 2024-03-23 – 2024-03-27 (×2): qty 30, 30d supply, fill #0

## 2024-03-23 NOTE — Telephone Encounter (Signed)
 Refills sent

## 2024-03-23 NOTE — Telephone Encounter (Signed)
 Copied from CRM [id number]. Topic: Clinical - Medication Refill >> Mar 23, 2024 12:29 PM Zebedee SAUNDERS wrote: Pt called was seen on 03/03/2024 by Dr. Newlin and has scheduled appt on 05/23/2024. Pharmacy has not received refill.   [facility] [address] [place] [address] Phone: [phone] Fax: [phone]

## 2024-03-23 NOTE — Telephone Encounter (Signed)
 Pt. Has appointment. Requested Prescriptions  Pending Prescriptions Disp Refills   levothyroxine  (SYNTHROID ) 50 MCG tablet 30 tablet 0    Sig: Take 1 tablet (50 mcg total) by mouth daily before breakfast. (Must have office visit for refills)     Endocrinology:  Hypothyroid Agents Failed - 03/23/2024  1:55 PM      Failed - TSH in normal range and within 360 days    TSH  Date Value Ref Range Status  03/04/2023 2.320 0.450 - 4.500 uIU/mL Final         Failed - Valid encounter within last 12 months    Recent Outpatient Visits           1 year ago Prediabetes   [facility] [place]. [facility] Delbert Clam, MD   1 year ago Other specified hypothyroidism   [facility] [place] [place]. [facility] Delbert Clam, MD   2 years ago Essential hypertension   [facility] [place]. [facility] Delbert Clam, MD   3 years ago Other specified hypothyroidism   [facility] [facility] [place]. [facility] Delbert Clam, MD   4 years ago Acute ear pain, bilateral   [facility] [place] [place]. [facility] [place], Virginia J, NP               labetalol  (NORMODYNE ) 100 MG tablet 60 tablet 0    Sig: Take 1 tablet (100 mg total) by mouth 2 (two) times daily. Please schedule visit with Dr. Newlin for additional refills.     Cardiovascular:  Beta Blockers Failed - 03/23/2024  1:55 PM      Failed - Last BP in normal range    BP Readings from Last 1 Encounters:  03/04/23 (!) 164/90         Failed - Valid encounter within last 6 months    Recent Outpatient Visits           1 year ago Prediabetes   [facility] [place]. [facility] Delbert Clam, MD   1 year ago Other specified hypothyroidism   [facility] [place] [facility] [place]. [facility] Delbert Clam, MD   2 years ago Essential hypertension   [facility] [place] [facility] [place]. [facility] Delbert Clam, MD   3 years ago Other specified hypothyroidism   [facility] [place] [facility] [place]. [facility] Delbert Clam, MD   4 years ago Acute ear pain, bilateral   [facility] [place] [facility] Of . [facility] [place], Virginia J, NP              Passed - Last Heart Rate in normal range    Pulse Readings from Last 1 Encounters:  03/04/23 100

## 2024-03-27 ENCOUNTER — Other Ambulatory Visit: Payer: Self-pay

## 2024-05-01 ENCOUNTER — Telehealth: Payer: Self-pay | Admitting: Family Medicine

## 2024-05-01 NOTE — Telephone Encounter (Unsigned)
 Copied from CRM [id number]. Topic: Clinical - Medication Question >> May 01, 2024  4:02 PM [name] wrote: Reason for CRM: Patient stated she is needing a refill on her labetalol  (NORMODYNE ) 100 MG tablet and levothyroxine  (SYNTHROID ) 50 MCG tablet. I did advise her that Dr. Newlin stated she needed to be seen first before she refill these medications. I checked the appts to see if she could come in 05/04/2024 and she stated no. Patient needing to know if she could send in an emergency supply so she would not be without medications that long.   Sharon [phone]

## 2024-05-02 ENCOUNTER — Other Ambulatory Visit: Payer: Self-pay

## 2024-05-02 ENCOUNTER — Other Ambulatory Visit: Payer: Self-pay | Admitting: Pharmacist

## 2024-05-02 DIAGNOSIS — I10 Essential (primary) hypertension: Secondary | ICD-10-CM

## 2024-05-02 DIAGNOSIS — E038 Other specified hypothyroidism: Secondary | ICD-10-CM

## 2024-05-02 MED ORDER — LEVOTHYROXINE SODIUM 50 MCG PO TABS
50.0000 ug | ORAL_TABLET | Freq: Every day | ORAL | 0 refills | Status: AC
  Filled 2024-05-02: qty 30, 30d supply, fill #0

## 2024-05-02 MED ORDER — LABETALOL HCL 100 MG PO TABS
100.0000 mg | ORAL_TABLET | Freq: Two times a day (BID) | ORAL | 0 refills | Status: AC
  Filled 2024-05-02: qty 60, 30d supply, fill #0

## 2024-05-02 NOTE — Telephone Encounter (Signed)
 Rxn sent to carry patient over until her appt next month.

## 2024-05-05 ENCOUNTER — Other Ambulatory Visit: Payer: Self-pay

## 2024-05-12 ENCOUNTER — Other Ambulatory Visit: Payer: Self-pay

## 2024-05-23 ENCOUNTER — Ambulatory Visit: Payer: Self-pay | Admitting: Family Medicine
